# Patient Record
Sex: Female | Born: 1965
Health system: Southern US, Community
[De-identification: ages and names within clinical notes are randomized; demographics above are authoritative.]

## PROBLEM LIST (undated history)

## (undated) DIAGNOSIS — J4520 Mild intermittent asthma, uncomplicated: Secondary | ICD-10-CM

## (undated) HISTORY — DX: Mild intermittent asthma, uncomplicated: J45.20

---

## 2018-01-09 ENCOUNTER — Ambulatory Visit: Payer: Managed Care, Other (non HMO) | Admitting: Allergy and Immunology

## 2018-01-09 ENCOUNTER — Encounter: Payer: Self-pay | Admitting: Allergy and Immunology

## 2018-01-09 VITALS — BP 140/76 | HR 78 | Temp 98.0°F | Resp 16 | Ht 73.0 in | Wt 365.4 lb

## 2018-01-09 DIAGNOSIS — J3089 Other allergic rhinitis: Secondary | ICD-10-CM

## 2018-01-09 DIAGNOSIS — J4520 Mild intermittent asthma, uncomplicated: Secondary | ICD-10-CM

## 2018-01-09 HISTORY — DX: Mild intermittent asthma, uncomplicated: J45.20

## 2018-01-09 MED ORDER — EPINEPHRINE 0.3 MG/0.3ML IJ SOAJ: 0.3000 mg | Freq: Once | INTRAMUSCULAR | 2 refills | Status: AC

## 2018-01-09 NOTE — Progress Notes (Signed)
Pt brought in allergy vials from outside source to continue immunotherapy in our clinic. Office procedures for immunotherapy explained and all consent forms signed. Pt verbalizes understanding. Given injections today per [facility] protocol. [place] and Vial M, to received injection 2x a week at a maintanance dose of .20 on each. Pt states that she has EpiPen.  Medical Practice contact:  [facility] Saba S. Marland McalpineSheikh, MD [address] [place], [place] [id number] Phone: [phone] Fax:      [phone]

## 2018-01-09 NOTE — Assessment & Plan Note (Signed)
The patient will resume aeroallergen immunotherapy under our care using the allergen vaccines and protocol provided by her previous allergist. She has access to epinephrine autoinjector.  For now, continue appropriate allergen avoidance measures as well as cetirizine and/or fluticasone nasal spray as needed.  Nasal saline spray (i.e., Simply Saline) or nasal saline lavage (i.e., NeilMed) is recommended as needed and prior to medicated nasal sprays.  Return for follow-up in 6 months, or sooner if necessary.

## 2018-01-09 NOTE — Assessment & Plan Note (Signed)
   Continue albuterol HFA, 1 to 2 inhalations every 6 hours if needed.  Subjective and objective measures of pulmonary function will be followed and the treatment plan will be adjusted accordingly. 

## 2018-01-09 NOTE — Progress Notes (Signed)
New Patient Note  RE: Rachel Mercado MRN: [id number] DOB: 26-Jun-1966 Date of Office Visit: 01/09/2018  Referring provider: No ref. provider found Primary care provider: Patient, No Pcp Per  Chief Complaint: Allergic Rhinitis  and Asthma   History of present illness: Rachel Mercado is a 52 y.o. female presenting today for evaluation of allergic rhinitis and asthma.  She reports that approximately 1 year ago she had an asthma exacerbation while on a commuter train during high pollen counts.  She went to the emergency department and was informed that she had had an allergy induced asthma attack.  She was referred to an allergist was found to be poly-sensitized to seasonal and perennial aeroallergens and was started on immunotherapy injections.  She is currently at maintenance dose of immunotherapy.  She has only required albuterol on one occasion over the past year, this occurred with exposure to high pollen counts.  She notes that prior to the allergy injections she had experienced nasal congestion, rhinorrhea, sneezing, and postnasal drainage.  Assessment and plan: Mild intermittent asthma  Continue albuterol HFA, 1 to 2 inhalations every 6 hours if needed.  Subjective and objective measures of pulmonary function will be followed and the treatment plan will be adjusted accordingly.  Perennial and seasonal allergic rhinitis The patient will resume aeroallergen immunotherapy under our care using the allergen vaccines and protocol provided by her previous allergist. She has access to epinephrine autoinjector.  For now, continue appropriate allergen avoidance measures as well as cetirizine and/or fluticasone nasal spray as needed.  Nasal saline spray (i.e., Simply Saline) or nasal saline lavage (i.e., NeilMed) is recommended as needed and prior to medicated nasal sprays.  Return for follow-up in 6 months, or sooner if necessary.   Meds ordered this encounter  Medications  .  EPINEPHrine (AUVI-Q) 0.3 mg/0.3 mL IJ SOAJ injection    Sig: Inject 0.3 mLs (0.3 mg total) into the muscle once for 1 dose. As directed for life-threatening allergic reactions    Dispense:  4 Device    Refill:  2    Diagnostics: Spirometry: Spirometry reveals an FVC of 3.32 L and an FEV1 of 2.97 L (97% predicted) with 6% postbronchodilator improvement.    Physical examination: Blood pressure 140/76, pulse 78, temperature 98 F (36.7 C), temperature source Oral, resp. rate 16, height 6\' 1"  (1.854 m), weight (!) 365 lb 6.4 oz (165.7 kg), SpO2 98 %.  General: Alert, interactive, in no acute distress. HEENT: TMs pearly gray, turbinates mildly edematous with clear discharge, post-pharynx unremarkable. Neck: Supple without lymphadenopathy. Lungs: Clear to auscultation without wheezing, rhonchi or rales. CV: Normal S1, S2 without murmurs. Abdomen: Nondistended, nontender. Skin: Warm and dry, without lesions or rashes. Extremities:  No clubbing, cyanosis or edema. Neuro:   Grossly intact.  Review of systems:  Review of systems negative except as noted in HPI / PMHx or noted below: Review of Systems  Constitutional: Negative.   HENT: Negative.   Eyes: Negative.   Respiratory: Negative.   Cardiovascular: Negative.   Gastrointestinal: Negative.   Genitourinary: Negative.   Musculoskeletal: Negative.   Skin: Negative.   Neurological: Negative.   Endo/Heme/Allergies: Negative.   Psychiatric/Behavioral: Negative.     Past medical history:  Past Medical History:  Diagnosis Date  . Mild intermittent asthma 01/09/2018    Past surgical history:  History reviewed. No pertinent surgical history.  Family history: Family History  Problem Relation Age of Onset  . Allergic rhinitis Neg Hx   . Asthma Neg  Hx   . Eczema Neg Hx     Social history: Social History   Socioeconomic History  . Marital status: Single    Spouse name: Not on file  . Number of children: Not on file  . Years  of education: Not on file  . Highest education level: Not on file  Occupational History  . Not on file  Social Needs  . Financial resource strain: Not on file  . Food insecurity:    Worry: Not on file    Inability: Not on file  . Transportation needs:    Medical: Not on file    Non-medical: Not on file  Tobacco Use  . Smoking status: Never Smoker  . Smokeless tobacco: Never Used  Substance and Sexual Activity  . Alcohol use: Never    Frequency: Never  . Drug use: Never  . Sexual activity: Not on file  Lifestyle  . Physical activity:    Days per week: Not on file    Minutes per session: Not on file  . Stress: Not on file  Relationships  . Social connections:    Talks on phone: Not on file    Gets together: Not on file    Attends religious service: Not on file    Active member of club or organization: Not on file    Attends meetings of clubs or organizations: Not on file    Relationship status: Not on file  . Intimate partner violence:    Fear of current or ex partner: Not on file    Emotionally abused: Not on file    Physically abused: Not on file    Forced sexual activity: Not on file  Other Topics Concern  . Not on file  Social History Narrative  . Not on file   Environmental History: The patient lives in a 53 year old house with carpeting the bedroom, gas heat, and central air.  There is no known mold/water damage in the home.  She is a non-smoker without pets.  Allergies as of 01/09/2018   No Known Allergies     Medication List        Accurate as of 01/09/18  1:29 PM. Always use your most recent med list.          cetirizine 5 MG tablet Commonly known as:  ZYRTEC Take 5 mg by mouth as needed for allergies.   EPINEPHrine 0.3 mg/0.3 mL Soaj injection Commonly known as:  AUVI-Q Inject 0.3 mLs (0.3 mg total) into the muscle once for 1 dose. As directed for life-threatening allergic reactions   fluticasone 50 MCG/ACT nasal spray Commonly known as:   FLONASE Place 1 spray into both nostrils as needed for allergies or rhinitis.   multivitamin tablet Take 1 tablet by mouth daily.   PROAIR RESPICLICK 108 (90 Base) MCG/ACT Aepb Generic drug:  Albuterol Sulfate Inhale 90 Inhalers into the lungs as needed.   Vitamin D (Ergocalciferol) 50000 units Caps capsule Commonly known as:  DRISDOL Take 5,000 Units by mouth 1 day or 1 dose.       Known medication allergies: No Known Allergies  I appreciate the opportunity to take part in Rachel Mercado's care. Please do not hesitate to contact me with questions.  Sincerely,   R. Jorene Guestarter Johnnathan Hagemeister, MD

## 2018-01-09 NOTE — Patient Instructions (Addendum)
Mild intermittent asthma  Continue albuterol HFA, 1 to 2 inhalations every 6 hours if needed.  Subjective and objective measures of pulmonary function will be followed and the treatment plan will be adjusted accordingly.  Perennial and seasonal allergic rhinitis The patient will resume aeroallergen immunotherapy under our care using the allergen vaccines and protocol provided by her previous allergist. She has access to epinephrine autoinjector.  For now, continue appropriate allergen avoidance measures as well as cetirizine and/or fluticasone nasal spray as needed.  Nasal saline spray (i.e., Simply Saline) or nasal saline lavage (i.e., NeilMed) is recommended as needed and prior to medicated nasal sprays.  Return for follow-up in 6 months, or sooner if necessary.   Return in about 6 months (around 07/11/2018), or if symptoms worsen or fail to improve.  Reducing Pollen Exposure  The American Academy of Allergy, Asthma and Immunology suggests the following steps to reduce your exposure to pollen during allergy seasons.    1. Do not hang sheets or clothing out to dry; pollen may collect on these items. 2. Do not mow lawns or spend time around freshly cut grass; mowing stirs up pollen. 3. Keep windows closed at night.  Keep car windows closed while driving. 4. Minimize morning activities outdoors, a time when pollen counts are usually at their highest. 5. Stay indoors as much as possible when pollen counts or humidity is high and on windy days when pollen tends to remain in the air longer. 6. Use air conditioning when possible.  Many air conditioners have filters that trap the pollen spores. 7. Use a HEPA room air filter to remove pollen form the indoor air you breathe.   Control of House Dust Mite Allergen  House dust mites play a major role in allergic asthma and rhinitis.  They occur in environments with high humidity wherever human skin, the food for dust mites is found. High levels  have been detected in dust obtained from mattresses, pillows, carpets, upholstered furniture, bed covers, clothes and soft toys.  The principal allergen of the house dust mite is found in its feces.  A gram of dust may contain 1,000 mites and 250,000 fecal particles.  Mite antigen is easily measured in the air during house cleaning activities.    1. Encase mattresses, including the box spring, and pillow, in an air tight cover.  Seal the zipper end of the encased mattresses with wide adhesive tape. 2. Wash the bedding in water of 130 degrees Farenheit weekly.  Avoid cotton comforters/quilts and flannel bedding: the most ideal bed covering is the dacron comforter. 3. Remove all upholstered furniture from the bedroom. 4. Remove carpets, carpet padding, rugs, and non-washable window drapes from the bedroom.  Wash drapes weekly or use plastic window coverings. 5. Remove all non-washable stuffed toys from the bedroom.  Wash stuffed toys weekly. 6. Have the room cleaned frequently with a vacuum cleaner and a damp dust-mop.  The patient should not be in a room which is being cleaned and should wait 1 hour after cleaning before going into the room. 7. Close and seal all heating outlets in the bedroom.  Otherwise, the room will become filled with dust-laden air.  An electric heater can be used to heat the room. Reduce indoor humidity to less than 50%.  Do not use a humidifier.  Control of Dog or Cat Allergen  Avoidance is the best way to manage a dog or cat allergy. If you have a dog or cat and are allergic to  dog or cats, consider removing the dog or cat from the home. If you have a dog or cat but don't want to find it a new home, or if your family wants a pet even though someone in the household is allergic, here are some strategies that may help keep symptoms at bay:  1. Keep the pet out of your bedroom and restrict it to only a few rooms. Be advised that keeping the dog or cat in only one room will not  limit the allergens to that room. 2. Don't pet, hug or kiss the dog or cat; if you do, wash your hands with soap and water. 3. High-efficiency particulate air (HEPA) cleaners run continuously in a bedroom or living room can reduce allergen levels over time. 4. Place electrostatic material sheet in the air inlet vent in the bedroom. 5. Regular use of a high-efficiency vacuum cleaner or a central vacuum can reduce allergen levels. 6. Giving your dog or cat a bath at least once a week can reduce airborne allergen.  Control of Mold Allergen  Mold and fungi can grow on a variety of surfaces provided certain temperature and moisture conditions exist.  Outdoor molds grow on plants, decaying vegetation and soil.  The major outdoor mold, Alternaria and Cladosporium, are found in very high numbers during hot and dry conditions.  Generally, a late Summer - Fall peak is seen for common outdoor fungal spores.  Rain will temporarily lower outdoor mold spore count, but counts rise rapidly when the rainy period ends.  The most important indoor molds are Aspergillus and Penicillium.  Dark, humid and poorly ventilated basements are ideal sites for mold growth.  The next most common sites of mold growth are the bathroom and the kitchen.  Outdoor MicrosoftMold Control 1. Use air conditioning and keep windows closed 2. Avoid exposure to decaying vegetation. 3. Avoid leaf raking. 4. Avoid grain handling. 5. Consider wearing a face mask if working in moldy areas.  Indoor Mold Control 1. Maintain humidity below 50%. 2. Clean washable surfaces with 5% bleach solution. 3. Remove sources e.g. Contaminated carpets.  Control of Cockroach Allergen  Cockroach allergen has been identified as an important cause of acute attacks of asthma, especially in urban settings.  There are fifty-five species of cockroach that exist in the [place], however only three, the [place], [place] and Oriental species produce allergen that can  affect patients with Asthma.  Allergens can be obtained from fecal particles, egg casings and secretions from cockroaches.    1. Remove food sources. 2. Reduce access to water. 3. Seal access and entry points. 4. Spray runways with 0.5-1% Diazinon or Chlorpyrifos 5. Blow boric acid power under stoves and refrigerator. 6. Place bait stations (hydramethylnon) at feeding sites.

## 2018-01-10 ENCOUNTER — Telehealth: Payer: Self-pay

## 2018-01-10 NOTE — Telephone Encounter (Signed)
Left message for patient to call. We have contacted [facility] regarding her allergy vial transfer to our clinic. Vial were unlabeled as to the specific extract used. They informed us that they are a [employer] company which allows patients to administer injections to themselves at home and they are not at liberty to disclose serum information. They have also stated that once the patients vials expire or run out, they cannot re-order vials. Pt will have to start on our serum injections and restart her immunotherapy with us.

## 2018-01-11 NOTE — Telephone Encounter (Signed)
Pt advised of [facility] protocol for inj. Plans to have allergy testing with us.

## 2018-01-17 ENCOUNTER — Ambulatory Visit (INDEPENDENT_AMBULATORY_CARE_PROVIDER_SITE_OTHER): Payer: Managed Care, Other (non HMO) | Admitting: *Deleted

## 2018-01-17 DIAGNOSIS — J309 Allergic rhinitis, unspecified: Secondary | ICD-10-CM

## 2018-01-24 ENCOUNTER — Ambulatory Visit (INDEPENDENT_AMBULATORY_CARE_PROVIDER_SITE_OTHER): Payer: Managed Care, Other (non HMO) | Admitting: *Deleted

## 2018-01-24 DIAGNOSIS — J309 Allergic rhinitis, unspecified: Secondary | ICD-10-CM

## 2018-07-31 ENCOUNTER — Encounter: Payer: Self-pay | Admitting: Allergy and Immunology

## 2018-07-31 ENCOUNTER — Encounter: Payer: 59 | Admitting: Allergy and Immunology

## 2018-07-31 MED ORDER — ALBUTEROL SULFATE 108 (90 BASE) MCG/ACT IN AEPB: 90.0000 | INHALATION_SPRAY | RESPIRATORY_TRACT | 0 refills | Status: DC | PRN

## 2018-07-31 NOTE — Progress Notes (Signed)
This encounter was created in error - please disregard.

## 2018-08-07 ENCOUNTER — Ambulatory Visit (INDEPENDENT_AMBULATORY_CARE_PROVIDER_SITE_OTHER): Payer: 59 | Admitting: Allergy and Immunology

## 2018-08-07 ENCOUNTER — Encounter: Payer: Self-pay | Admitting: Allergy and Immunology

## 2018-08-07 VITALS — BP 108/66 | HR 80 | Resp 18 | Ht 74.0 in | Wt 380.0 lb

## 2018-08-07 DIAGNOSIS — H1013 Acute atopic conjunctivitis, bilateral: Secondary | ICD-10-CM | POA: Diagnosis not present

## 2018-08-07 DIAGNOSIS — J3089 Other allergic rhinitis: Secondary | ICD-10-CM | POA: Diagnosis not present

## 2018-08-07 DIAGNOSIS — J4520 Mild intermittent asthma, uncomplicated: Secondary | ICD-10-CM

## 2018-08-07 DIAGNOSIS — H1010 Acute atopic conjunctivitis, unspecified eye: Secondary | ICD-10-CM | POA: Insufficient documentation

## 2018-08-07 MED ORDER — OLOPATADINE HCL 0.2 % OP SOLN: 1.0000 [drp] | OPHTHALMIC | 5 refills | Status: DC

## 2018-08-07 MED ORDER — LEVOCETIRIZINE DIHYDROCHLORIDE 5 MG PO TABS: 5.0000 mg | ORAL_TABLET | Freq: Every evening | ORAL | 5 refills | Status: DC

## 2018-08-07 MED ORDER — AZELASTINE-FLUTICASONE 137-50 MCG/ACT NA SUSP: 2.0000 | NASAL | 5 refills | Status: DC

## 2018-08-07 NOTE — Assessment & Plan Note (Signed)
   Treatment plan as outlined above for allergic rhinitis.  A prescription has been provided for Pataday, one drop per eye daily as needed. 

## 2018-08-07 NOTE — Patient Instructions (Addendum)
Perennial and seasonal allergic rhinitis  Aeroallergen avoidance measures have been discussed and provided in written form.  A prescription has been provided for levocetirizine, 5 mg daily as needed.  A prescription has been provided for Dymista (azelastine/fluticasone) nasal spray, 1 spray per nostril twice daily as needed. Proper nasal spray technique has been discussed and demonstrated.  Nasal saline spray (i.e., Simply Saline) or nasal saline lavage (i.e., NeilMed) is recommended as needed and prior to medicated nasal sprays.  The patient is motivated to restart immunotherapy to reduce symptoms and decrease medication requirement.   Mild intermittent asthma  Continue albuterol HFA, 1 to 2 inhalations every 6 hours if needed.  Subjective and objective measures of pulmonary function will be followed and the treatment plan will be adjusted accordingly.  Allergic conjunctivitis  Treatment plan as outlined above for allergic rhinitis.  A prescription has been provided for Pataday, one drop per eye daily as needed.   Return in about 4 months (around 12/07/2018), or if symptoms worsen or fail to improve.   Control of Dust Mite Allergen  House dust mites play a major role in allergic asthma and rhinitis.  They occur in environments with high humidity wherever human skin, the food for dust mites is found. High levels have been detected in dust obtained from mattresses, pillows, carpets, upholstered furniture, bed covers, clothes and soft toys.  The principal allergen of the house dust mite is found in its feces.  A gram of dust may contain 1,000 mites and 250,000 fecal particles.  Mite antigen is easily measured in the air during house cleaning activities.    1. Encase mattresses, including the box spring, and pillow, in an air tight cover.  Seal the zipper end of the encased mattresses with wide adhesive tape. 2. Wash the bedding in water of 130 degrees Farenheit weekly.  Avoid cotton  comforters/quilts and flannel bedding: the most ideal bed covering is the dacron comforter. 3. Remove all upholstered furniture from the bedroom. 4. Remove carpets, carpet padding, rugs, and non-washable window drapes from the bedroom.  Wash drapes weekly or use plastic window coverings. 5. Remove all non-washable stuffed toys from the bedroom.  Wash stuffed toys weekly. 6. Have the room cleaned frequently with a vacuum cleaner and a damp dust-mop.  The patient should not be in a room which is being cleaned and should wait 1 hour after cleaning before going into the room. 7. Close and seal all heating outlets in the bedroom.  Otherwise, the room will become filled with dust-laden air.  An electric heater can be used to heat the room. Reduce indoor humidity to less than 50%.  Do not use a humidifier.   Reducing Pollen Exposure  The American Academy of Allergy, Asthma and Immunology suggests the following steps to reduce your exposure to pollen during allergy seasons.    1. Do not hang sheets or clothing out to dry; pollen may collect on these items. 2. Do not mow lawns or spend time around freshly cut grass; mowing stirs up pollen. 3. Keep windows closed at night.  Keep car windows closed while driving. 4. Minimize morning activities outdoors, a time when pollen counts are usually at their highest. 5. Stay indoors as much as possible when pollen counts or humidity is high and on windy days when pollen tends to remain in the air longer. 6. Use air conditioning when possible.  Many air conditioners have filters that trap the pollen spores. 7. Use a HEPA room air  filter to remove pollen form the indoor air you breathe.   Control of Dog or Cat Allergen  Avoidance is the best way to manage a dog or cat allergy. If you have a dog or cat and are allergic to dog or cats, consider removing the dog or cat from the home. If you have a dog or cat but don't want to find it a new home, or if your family  wants a pet even though someone in the household is allergic, here are some strategies that may help keep symptoms at bay:  1. Keep the pet out of your bedroom and restrict it to only a few rooms. Be advised that keeping the dog or cat in only one room will not limit the allergens to that room. 2. Don't pet, hug or kiss the dog or cat; if you do, wash your hands with soap and water. 3. High-efficiency particulate air (HEPA) cleaners run continuously in a bedroom or living room can reduce allergen levels over time. 4. Place electrostatic material sheet in the air inlet vent in the bedroom. 5. Regular use of a high-efficiency vacuum cleaner or a central vacuum can reduce allergen levels. 6. Giving your dog or cat a bath at least once a week can reduce airborne allergen.   Control of Mold Allergen  Mold and fungi can grow on a variety of surfaces provided certain temperature and moisture conditions exist.  Outdoor molds grow on plants, decaying vegetation and soil.  The major outdoor mold, Alternaria and Cladosporium, are found in very high numbers during hot and dry conditions.  Generally, a late Summer - Fall peak is seen for common outdoor fungal spores.  Rain will temporarily lower outdoor mold spore count, but counts rise rapidly when the rainy period ends.  The most important indoor molds are Aspergillus and Penicillium.  Dark, humid and poorly ventilated basements are ideal sites for mold growth.  The next most common sites of mold growth are the bathroom and the kitchen.  Outdoor [employer]old Control 1. Use air conditioning and keep windows closed 2. Avoid exposure to decaying vegetation. 3. Avoid leaf raking. 4. Avoid grain handling. 5. Consider wearing a face mask if working in moldy areas.  Indoor Mold Control 1. Maintain humidity below 50%. 2. Clean washable surfaces with 5% bleach solution. 3. Remove sources e.g. Contaminated carpets.   Control of Cockroach Allergen  Cockroach allergen  has been identified as an important cause of acute attacks of asthma, especially in urban settings.  There are fifty-five species of cockroach that exist in the [place], however only three, the [place], [place] and Oriental species produce allergen that can affect patients with Asthma.  Allergens can be obtained from fecal particles, egg casings and secretions from cockroaches.    1. Remove food sources. 2. Reduce access to water. 3. Seal access and entry points. 4. Spray runways with 0.5-1% Diazinon or Chlorpyrifos 5. Blow boric acid power under stoves and refrigerator. 6. Place bait stations (hydramethylnon) at feeding sites.

## 2018-08-07 NOTE — Progress Notes (Signed)
Follow-up Note  RE: Rachel ShirtsGloria Pigman MRN: [id number] DOB: Oct 31, 1965 Date of Office Visit: 08/07/2018  Primary care provider: Patient, No Pcp Per Referring provider: No ref. provider found  History of present illness: Rachel Mercado is a 52 y.o. female presenting today for re-evaluation of rhinoconjunctivitis and intermittent asthma. She was last seen in this clinic in June 2019.  She presents today for allergy skin testing in anticipation of re-mixing vials and restarting aeroallergen immunotherapy injections.  She has been off of antihistamines over the last 3 days in anticipation of today's skin testing.  She reports that she acquired a cat approximately 1 to 2 months ago and has noticed a definite correlation of rhinoconjunctivitis symptoms with exposure to the cat.  She rarely requires albuterol rescue, typically only with vigorous exertion/exercise, does not experience nocturnal awakenings due to lower respiratory symptoms or limitations in normal daily activities due to asthma symptoms.  Assessment and plan: Perennial and seasonal allergic rhinitis  Aeroallergen avoidance measures have been discussed and provided in written form.  A prescription has been provided for levocetirizine, 5 mg daily as needed.  A prescription has been provided for Dymista (azelastine/fluticasone) nasal spray, 1 spray per nostril twice daily as needed. Proper nasal spray technique has been discussed and demonstrated.  Nasal saline spray (i.e., Simply Saline) or nasal saline lavage (i.e., NeilMed) is recommended as needed and prior to medicated nasal sprays.  The patient is motivated to restart immunotherapy to reduce symptoms and decrease medication requirement.   Mild intermittent asthma  Continue albuterol HFA, 1 to 2 inhalations every 6 hours if needed.  Subjective and objective measures of pulmonary function will be followed and the treatment plan will be adjusted accordingly.  Allergic  conjunctivitis  Treatment plan as outlined above for allergic rhinitis.  A prescription has been provided for Pataday, one drop per eye daily as needed.   Meds ordered this encounter  Medications  . levocetirizine (XYZAL) 5 MG tablet    Sig: Take 1 tablet (5 mg total) by mouth every evening.    Dispense:  30 tablet    Refill:  5  . Azelastine-Fluticasone (DYMISTA) 137-50 MCG/ACT SUSP    Sig: Place 2 sprays into both nostrils 1 day or 1 dose.    Dispense:  1 Bottle    Refill:  5  . Olopatadine HCl (PATADAY) 0.2 % SOLN    Sig: Place 1 drop into both eyes 1 day or 1 dose.    Dispense:  1 Bottle    Refill:  5    Diagnostics: Aeroallergen skin testing: Positive to grass pollen, weed pollen, ragweed pollen, tree pollen, cat hair, cockroach antigen, and dust mite antigen.    Physical examination: Blood pressure 108/66, pulse 80, resp. rate 18, height 6\' 2"  (1.88 m), weight (!) 380 lb (172.4 kg), SpO2 97 %.  General: Alert, interactive, in no acute distress. HEENT: TMs pearly gray, turbinates moderately edematous with clear discharge, post-pharynx moderately erythematous. Neck: Supple without lymphadenopathy. Lungs: Clear to auscultation without wheezing, rhonchi or rales. CV: Normal S1, S2 without murmurs. Skin: Warm and dry, without lesions or rashes.  The following portions of the patient's history were reviewed and updated as appropriate: allergies, current medications, past family history, past medical history, past social history, past surgical history and problem list.  Allergies as of 08/07/2018   No Known Allergies     Medication List       Accurate as of August 07, 2018  1:08 PM. Always use  your most recent med list.        Albuterol Sulfate 108 (90 Base) MCG/ACT Aepb Commonly known as:  PROAIR RESPICLICK Inhale 90 Inhalers into the lungs as needed.   Azelastine-Fluticasone 137-50 MCG/ACT Susp Commonly known as:  DYMISTA Place 2 sprays into both nostrils 1  day or 1 dose.   cetirizine 5 MG tablet Commonly known as:  ZYRTEC Take 5 mg by mouth as needed for allergies.   fluticasone 50 MCG/ACT nasal spray Commonly known as:  FLONASE Place 1 spray into both nostrils as needed for allergies or rhinitis.   levocetirizine 5 MG tablet Commonly known as:  XYZAL Take 1 tablet (5 mg total) by mouth every evening.   multivitamin tablet Take 1 tablet by mouth daily.   Olopatadine HCl 0.2 % Soln Commonly known as:  PATADAY Place 1 drop into both eyes 1 day or 1 dose.   Vitamin D (Ergocalciferol) 1.25 MG (50000 UT) Caps capsule Commonly known as:  DRISDOL Take 5,000 Units by mouth 1 day or 1 dose.       No Known Allergies  Review of systems: Review of systems negative except as noted in HPI / PMHx or noted below: Constitutional: Negative.  HENT: Negative.   Eyes: Negative.  Respiratory: Negative.   Cardiovascular: Negative.  Gastrointestinal: Negative.  Genitourinary: Negative.  Musculoskeletal: Negative.  Neurological: Negative.  Endo/Heme/Allergies: Negative.  Cutaneous: Negative.  Past Medical History:  Diagnosis Date  . Mild intermittent asthma 01/09/2018    Family History  Problem Relation Age of Onset  . Allergic rhinitis Neg Hx   . Asthma Neg Hx   . Eczema Neg Hx     Social History   Socioeconomic History  . Marital status: Single    Spouse name: Not on file  . Number of children: Not on file  . Years of education: Not on file  . Highest education level: Not on file  Occupational History  . Not on file  Social Needs  . Financial resource strain: Not on file  . Food insecurity:    Worry: Not on file    Inability: Not on file  . Transportation needs:    Medical: Not on file    Non-medical: Not on file  Tobacco Use  . Smoking status: Never Smoker  . Smokeless tobacco: Never Used  Substance and Sexual Activity  . Alcohol use: Never    Frequency: Never  . Drug use: Never  . Sexual activity: Not on file    Lifestyle  . Physical activity:    Days per week: Not on file    Minutes per session: Not on file  . Stress: Not on file  Relationships  . Social connections:    Talks on phone: Not on file    Gets together: Not on file    Attends religious service: Not on file    Active member of club or organization: Not on file    Attends meetings of clubs or organizations: Not on file    Relationship status: Not on file  . Intimate partner violence:    Fear of current or ex partner: Not on file    Emotionally abused: Not on file    Physically abused: Not on file    Forced sexual activity: Not on file  Other Topics Concern  . Not on file  Social History Narrative  . Not on file    I appreciate the opportunity to take part in Geeta's care. Please do not hesitate  to contact me with questions.  Sincerely,   R. Edgar Frisk, MD

## 2018-08-07 NOTE — Assessment & Plan Note (Signed)
   Continue albuterol HFA, 1 to 2 inhalations every 6 hours if needed.  Subjective and objective measures of pulmonary function will be followed and the treatment plan will be adjusted accordingly. 

## 2018-08-07 NOTE — Assessment & Plan Note (Signed)
   Aeroallergen avoidance measures have been discussed and provided in written form.  A prescription has been provided for levocetirizine, 5 mg daily as needed.  A prescription has been provided for Dymista (azelastine/fluticasone) nasal spray, 1 spray per nostril twice daily as needed. Proper nasal spray technique has been discussed and demonstrated.  Nasal saline spray (i.e., Simply Saline) or nasal saline lavage (i.e., NeilMed) is recommended as needed and prior to medicated nasal sprays.  The patient is motivated to restart immunotherapy to reduce symptoms and decrease medication requirement.

## 2018-08-08 ENCOUNTER — Telehealth: Payer: Self-pay | Admitting: Allergy and Immunology

## 2018-08-08 MED ORDER — AZELASTINE HCL 0.1 % NA SOLN: 1.0000 | Freq: Two times a day (BID) | NASAL | 3 refills | Status: DC | PRN

## 2018-08-08 MED ORDER — FLUTICASONE PROPIONATE 50 MCG/ACT NA SUSP: 1.0000 | Freq: Two times a day (BID) | NASAL | 3 refills | Status: AC | PRN

## 2018-08-08 NOTE — Telephone Encounter (Signed)
Spoke with patient, she is aware that I have called in rx for flonase and astelin.

## 2018-08-08 NOTE — Progress Notes (Signed)
VIALS EXP 08-09-19 

## 2018-08-08 NOTE — Telephone Encounter (Signed)
Pt called and said ins does not cover the Dymista and needs something else. [facility] [address] [phone].

## 2018-08-10 DIAGNOSIS — J3089 Other allergic rhinitis: Secondary | ICD-10-CM | POA: Diagnosis not present

## 2018-08-11 DIAGNOSIS — J301 Allergic rhinitis due to pollen: Secondary | ICD-10-CM

## 2018-08-21 ENCOUNTER — Ambulatory Visit: Payer: 59

## 2018-08-21 ENCOUNTER — Ambulatory Visit: Payer: Self-pay

## 2018-09-01 ENCOUNTER — Ambulatory Visit: Payer: Self-pay

## 2018-09-01 ENCOUNTER — Ambulatory Visit (INDEPENDENT_AMBULATORY_CARE_PROVIDER_SITE_OTHER): Payer: 59 | Admitting: *Deleted

## 2018-09-01 DIAGNOSIS — J309 Allergic rhinitis, unspecified: Secondary | ICD-10-CM

## 2018-09-01 NOTE — Progress Notes (Signed)
Immunotherapy   Patient Details  Name: Rachel Mercado MRN: [id number] Date of Birth: 1965/09/14  09/01/2018  Manfred Shirts started injections for  Grass-Dmite-Cat-Cockroach & Weed-Tree. Following schedule: A  Frequency:2 times per week Epi-Pen:Epi-Pen Available  Consent signed and patient instructions given. No problems after 30 minutes in office.   Mariane Duval 09/01/2018, 9:55 AM

## 2018-09-08 ENCOUNTER — Ambulatory Visit (INDEPENDENT_AMBULATORY_CARE_PROVIDER_SITE_OTHER): Payer: 59 | Admitting: *Deleted

## 2018-09-08 DIAGNOSIS — J309 Allergic rhinitis, unspecified: Secondary | ICD-10-CM

## 2018-09-12 ENCOUNTER — Ambulatory Visit (INDEPENDENT_AMBULATORY_CARE_PROVIDER_SITE_OTHER): Payer: 59

## 2018-09-12 DIAGNOSIS — J309 Allergic rhinitis, unspecified: Secondary | ICD-10-CM | POA: Diagnosis not present

## 2018-09-15 ENCOUNTER — Ambulatory Visit (INDEPENDENT_AMBULATORY_CARE_PROVIDER_SITE_OTHER): Payer: 59

## 2018-09-15 DIAGNOSIS — J309 Allergic rhinitis, unspecified: Secondary | ICD-10-CM | POA: Diagnosis not present

## 2018-09-18 ENCOUNTER — Ambulatory Visit (INDEPENDENT_AMBULATORY_CARE_PROVIDER_SITE_OTHER): Payer: 59 | Admitting: *Deleted

## 2018-09-18 DIAGNOSIS — J309 Allergic rhinitis, unspecified: Secondary | ICD-10-CM

## 2018-09-21 ENCOUNTER — Ambulatory Visit (INDEPENDENT_AMBULATORY_CARE_PROVIDER_SITE_OTHER): Payer: 59 | Admitting: *Deleted

## 2018-09-21 DIAGNOSIS — J309 Allergic rhinitis, unspecified: Secondary | ICD-10-CM

## 2018-09-28 ENCOUNTER — Ambulatory Visit (INDEPENDENT_AMBULATORY_CARE_PROVIDER_SITE_OTHER): Payer: 59

## 2018-09-28 DIAGNOSIS — J309 Allergic rhinitis, unspecified: Secondary | ICD-10-CM

## 2018-10-02 ENCOUNTER — Ambulatory Visit (INDEPENDENT_AMBULATORY_CARE_PROVIDER_SITE_OTHER): Payer: 59

## 2018-10-02 DIAGNOSIS — J309 Allergic rhinitis, unspecified: Secondary | ICD-10-CM

## 2018-10-05 ENCOUNTER — Ambulatory Visit (INDEPENDENT_AMBULATORY_CARE_PROVIDER_SITE_OTHER): Payer: 59 | Admitting: *Deleted

## 2018-10-05 DIAGNOSIS — J309 Allergic rhinitis, unspecified: Secondary | ICD-10-CM

## 2018-10-09 ENCOUNTER — Ambulatory Visit (INDEPENDENT_AMBULATORY_CARE_PROVIDER_SITE_OTHER): Payer: 59

## 2018-10-09 DIAGNOSIS — J309 Allergic rhinitis, unspecified: Secondary | ICD-10-CM | POA: Diagnosis not present

## 2018-10-12 ENCOUNTER — Ambulatory Visit (INDEPENDENT_AMBULATORY_CARE_PROVIDER_SITE_OTHER): Payer: 59 | Admitting: *Deleted

## 2018-10-12 DIAGNOSIS — J309 Allergic rhinitis, unspecified: Secondary | ICD-10-CM | POA: Diagnosis not present

## 2018-10-16 ENCOUNTER — Ambulatory Visit (INDEPENDENT_AMBULATORY_CARE_PROVIDER_SITE_OTHER): Payer: 59

## 2018-10-16 DIAGNOSIS — J309 Allergic rhinitis, unspecified: Secondary | ICD-10-CM | POA: Diagnosis not present

## 2018-10-19 ENCOUNTER — Ambulatory Visit (INDEPENDENT_AMBULATORY_CARE_PROVIDER_SITE_OTHER): Payer: 59 | Admitting: *Deleted

## 2018-10-19 DIAGNOSIS — J309 Allergic rhinitis, unspecified: Secondary | ICD-10-CM

## 2018-10-26 ENCOUNTER — Ambulatory Visit (INDEPENDENT_AMBULATORY_CARE_PROVIDER_SITE_OTHER): Payer: 59

## 2018-10-26 DIAGNOSIS — J309 Allergic rhinitis, unspecified: Secondary | ICD-10-CM

## 2018-10-30 ENCOUNTER — Ambulatory Visit (INDEPENDENT_AMBULATORY_CARE_PROVIDER_SITE_OTHER): Payer: 59 | Admitting: *Deleted

## 2018-10-30 DIAGNOSIS — J309 Allergic rhinitis, unspecified: Secondary | ICD-10-CM

## 2018-11-02 ENCOUNTER — Ambulatory Visit (INDEPENDENT_AMBULATORY_CARE_PROVIDER_SITE_OTHER): Payer: 59

## 2018-11-02 DIAGNOSIS — J309 Allergic rhinitis, unspecified: Secondary | ICD-10-CM | POA: Diagnosis not present

## 2018-11-07 ENCOUNTER — Other Ambulatory Visit: Payer: Self-pay

## 2018-11-07 ENCOUNTER — Ambulatory Visit (INDEPENDENT_AMBULATORY_CARE_PROVIDER_SITE_OTHER): Payer: 59

## 2018-11-07 DIAGNOSIS — J309 Allergic rhinitis, unspecified: Secondary | ICD-10-CM

## 2018-11-14 ENCOUNTER — Ambulatory Visit (INDEPENDENT_AMBULATORY_CARE_PROVIDER_SITE_OTHER): Payer: 59

## 2018-11-14 ENCOUNTER — Other Ambulatory Visit: Payer: Self-pay

## 2018-11-14 DIAGNOSIS — J309 Allergic rhinitis, unspecified: Secondary | ICD-10-CM

## 2018-11-21 ENCOUNTER — Other Ambulatory Visit: Payer: Self-pay

## 2018-11-21 ENCOUNTER — Ambulatory Visit (INDEPENDENT_AMBULATORY_CARE_PROVIDER_SITE_OTHER): Payer: 59

## 2018-11-21 DIAGNOSIS — J309 Allergic rhinitis, unspecified: Secondary | ICD-10-CM | POA: Diagnosis not present

## 2018-11-27 ENCOUNTER — Other Ambulatory Visit: Payer: Self-pay

## 2018-11-27 ENCOUNTER — Encounter: Payer: Self-pay | Admitting: Allergy and Immunology

## 2018-11-27 ENCOUNTER — Ambulatory Visit: Payer: 59 | Admitting: Allergy and Immunology

## 2018-11-27 DIAGNOSIS — J4520 Mild intermittent asthma, uncomplicated: Secondary | ICD-10-CM

## 2018-11-27 DIAGNOSIS — J3089 Other allergic rhinitis: Secondary | ICD-10-CM

## 2018-11-27 NOTE — Progress Notes (Signed)
Follow-up Note  RE: Rachel Mercado MRN: [id number] DOB: April 21, 1966 Date of Office Visit: 11/27/2018  Primary care provider: Wilfred Curtis, MD Referring provider: No ref. provider found  History of present illness: Rachel Mercado is a 53 y.o. female with allergic rhinoconjunctivitis and intermittent asthma presenting today for follow-up.  She was last seen in this clinic in December 2019.  She reports that this spring with pollen exposure she has required albuterol rescue 1-2 times per week.  She does not experiencing nocturnal awakenings due to lower respiratory symptoms.  Her nasal allergy symptoms have been well controlled with azelastine nasal spray every evening and fluticasone nasal spray if needed in the morning.  She is receiving aero allergen immunotherapy injections without problems or complications.  Assessment and plan: Perennial and seasonal allergic rhinitis  Continue appropriate allergen avoidance measures, immunotherapy injections as prescribed, azelastine nasal spray, fluticasone nasal spray as needed, and levocetirizine if needed.  Nasal saline spray (i.e., Simply Saline) or nasal saline lavage (i.e., NeilMed) is recommended as needed and prior to medicated nasal sprays.  Mild intermittent asthma  Continue albuterol HFA, 1 to 2 inhalations every 4-6 hours if needed.  Subjective and objective measures of pulmonary function will be followed and the treatment plan will be adjusted accordingly.   Physical examination: Blood pressure (!) 138/58, pulse 82, temperature 98.5 F (36.9 C), temperature source Oral, resp. rate 16, height 5' 11.65" (1.82 m), weight (!) 387 lb (175.5 kg), SpO2 97 %.  General: Alert, interactive, in no acute distress. HEENT: TMs pearly gray, turbinates mildly edematous without discharge, post-pharynx mildly erythematous. Neck: Supple without lymphadenopathy. Lungs: Clear to auscultation without wheezing, rhonchi or rales. CV: Normal S1, S2  without murmurs. Skin: Warm and dry, without lesions or rashes.  The following portions of the patient's history were reviewed and updated as appropriate: allergies, current medications, past family history, past medical history, past social history, past surgical history and problem list.  Allergies as of 11/27/2018      Reactions   Shellfish Allergy Hives      Medication List       Accurate as of November 27, 2018  3:34 PM. Always use your most recent med list.        Albuterol Sulfate 108 (90 Base) MCG/ACT Aepb Commonly known as:  ProAir RespiClick Inhale 90 Inhalers into the lungs as needed.   azelastine 0.1 % nasal spray Commonly known as:  ASTELIN Place 1 spray into both nostrils 2 (two) times daily as needed for rhinitis. Use in each nostril as directed   FERROUS SULFATE IRON PO Take by mouth.   fluticasone 50 MCG/ACT nasal spray Commonly known as:  FLONASE Place 1 spray into both nostrils 2 (two) times daily as needed for allergies or rhinitis.   levocetirizine 5 MG tablet Commonly known as:  XYZAL Take 1 tablet (5 mg total) by mouth every evening.   multivitamin tablet Take 1 tablet by mouth daily.   phentermine 37.5 MG tablet Commonly known as:  ADIPEX-P TAKE 1/2 TAB 30 MIN BEFORE BREAKFAST AND 1/2 TAB AT 2 PM DAILY   Vitamin D (Ergocalciferol) 1.25 MG (50000 UT) Caps capsule Commonly known as:  DRISDOL Take 5,000 Units by mouth 1 day or 1 dose.       Allergies  Allergen Reactions  . Shellfish Allergy Hives    I appreciate the opportunity to take part in Rachel Mercado's care. Please do not hesitate to contact me with questions.  Sincerely,   R.  Edgar Frisk, MD

## 2018-11-27 NOTE — Assessment & Plan Note (Signed)
   Continue appropriate allergen avoidance measures, immunotherapy injections as prescribed, azelastine nasal spray, fluticasone nasal spray as needed, and levocetirizine if needed.  Nasal saline spray (i.e., Simply Saline) or nasal saline lavage (i.e., NeilMed) is recommended as needed and prior to medicated nasal sprays.

## 2018-11-27 NOTE — Patient Instructions (Addendum)
Perennial and seasonal allergic rhinitis  Continue appropriate allergen avoidance measures, immunotherapy injections as prescribed, azelastine nasal spray, fluticasone nasal spray as needed, and levocetirizine if needed.  Nasal saline spray (i.e., Simply Saline) or nasal saline lavage (i.e., NeilMed) is recommended as needed and prior to medicated nasal sprays.  Mild intermittent asthma  Continue albuterol HFA, 1 to 2 inhalations every 4-6 hours if needed.  Subjective and objective measures of pulmonary function will be followed and the treatment plan will be adjusted accordingly.   Return in about 5 months (around 04/29/2019), or if symptoms worsen or fail to improve.

## 2018-11-27 NOTE — Assessment & Plan Note (Signed)
   Continue albuterol HFA, 1 to 2 inhalations every 4-6 hours if needed.  Subjective and objective measures of pulmonary function will be followed and the treatment plan will be adjusted accordingly. 

## 2018-11-28 ENCOUNTER — Ambulatory Visit (INDEPENDENT_AMBULATORY_CARE_PROVIDER_SITE_OTHER): Payer: 59

## 2018-11-28 DIAGNOSIS — J309 Allergic rhinitis, unspecified: Secondary | ICD-10-CM | POA: Diagnosis not present

## 2018-12-05 ENCOUNTER — Other Ambulatory Visit: Payer: Self-pay

## 2018-12-05 ENCOUNTER — Ambulatory Visit (INDEPENDENT_AMBULATORY_CARE_PROVIDER_SITE_OTHER): Payer: 59

## 2018-12-05 DIAGNOSIS — J309 Allergic rhinitis, unspecified: Secondary | ICD-10-CM | POA: Diagnosis not present

## 2018-12-12 ENCOUNTER — Other Ambulatory Visit: Payer: Self-pay

## 2018-12-12 ENCOUNTER — Ambulatory Visit (INDEPENDENT_AMBULATORY_CARE_PROVIDER_SITE_OTHER): Payer: 59

## 2018-12-12 DIAGNOSIS — J309 Allergic rhinitis, unspecified: Secondary | ICD-10-CM | POA: Diagnosis not present

## 2018-12-19 ENCOUNTER — Ambulatory Visit (INDEPENDENT_AMBULATORY_CARE_PROVIDER_SITE_OTHER): Payer: 59

## 2018-12-19 ENCOUNTER — Other Ambulatory Visit: Payer: Self-pay

## 2018-12-19 DIAGNOSIS — J309 Allergic rhinitis, unspecified: Secondary | ICD-10-CM | POA: Diagnosis not present

## 2018-12-26 ENCOUNTER — Other Ambulatory Visit: Payer: Self-pay

## 2018-12-26 ENCOUNTER — Ambulatory Visit (INDEPENDENT_AMBULATORY_CARE_PROVIDER_SITE_OTHER): Payer: 59

## 2018-12-26 DIAGNOSIS — J309 Allergic rhinitis, unspecified: Secondary | ICD-10-CM

## 2019-01-02 ENCOUNTER — Ambulatory Visit (INDEPENDENT_AMBULATORY_CARE_PROVIDER_SITE_OTHER): Payer: 59

## 2019-01-02 ENCOUNTER — Other Ambulatory Visit: Payer: Self-pay

## 2019-01-02 DIAGNOSIS — J309 Allergic rhinitis, unspecified: Secondary | ICD-10-CM

## 2019-01-16 ENCOUNTER — Other Ambulatory Visit: Payer: Self-pay

## 2019-01-16 ENCOUNTER — Ambulatory Visit (INDEPENDENT_AMBULATORY_CARE_PROVIDER_SITE_OTHER): Payer: 59

## 2019-01-16 ENCOUNTER — Telehealth: Payer: Self-pay

## 2019-01-16 DIAGNOSIS — J309 Allergic rhinitis, unspecified: Secondary | ICD-10-CM | POA: Diagnosis not present

## 2019-01-16 MED ORDER — EPINEPHRINE 0.3 MG/0.3ML IJ SOAJ: 0.3000 mg | Freq: Once | INTRAMUSCULAR | 2 refills | Status: AC

## 2019-01-16 NOTE — Telephone Encounter (Signed)
Pt needs refill on Epipen

## 2019-01-23 ENCOUNTER — Ambulatory Visit (INDEPENDENT_AMBULATORY_CARE_PROVIDER_SITE_OTHER): Payer: 59

## 2019-01-23 ENCOUNTER — Other Ambulatory Visit: Payer: Self-pay

## 2019-01-23 DIAGNOSIS — J309 Allergic rhinitis, unspecified: Secondary | ICD-10-CM

## 2019-01-30 ENCOUNTER — Other Ambulatory Visit: Payer: Self-pay

## 2019-01-30 ENCOUNTER — Ambulatory Visit (INDEPENDENT_AMBULATORY_CARE_PROVIDER_SITE_OTHER): Payer: 59

## 2019-01-30 DIAGNOSIS — J309 Allergic rhinitis, unspecified: Secondary | ICD-10-CM

## 2019-02-06 ENCOUNTER — Other Ambulatory Visit: Payer: Self-pay

## 2019-02-06 ENCOUNTER — Ambulatory Visit (INDEPENDENT_AMBULATORY_CARE_PROVIDER_SITE_OTHER): Payer: 59

## 2019-02-06 DIAGNOSIS — J309 Allergic rhinitis, unspecified: Secondary | ICD-10-CM

## 2019-02-20 ENCOUNTER — Ambulatory Visit (INDEPENDENT_AMBULATORY_CARE_PROVIDER_SITE_OTHER): Payer: 59

## 2019-02-20 ENCOUNTER — Other Ambulatory Visit: Payer: Self-pay

## 2019-02-20 DIAGNOSIS — J309 Allergic rhinitis, unspecified: Secondary | ICD-10-CM | POA: Diagnosis not present

## 2019-02-27 ENCOUNTER — Ambulatory Visit (INDEPENDENT_AMBULATORY_CARE_PROVIDER_SITE_OTHER): Payer: 59

## 2019-02-27 ENCOUNTER — Other Ambulatory Visit: Payer: Self-pay

## 2019-02-27 DIAGNOSIS — J309 Allergic rhinitis, unspecified: Secondary | ICD-10-CM

## 2019-03-06 ENCOUNTER — Ambulatory Visit (INDEPENDENT_AMBULATORY_CARE_PROVIDER_SITE_OTHER): Payer: 59

## 2019-03-06 ENCOUNTER — Other Ambulatory Visit: Payer: Self-pay

## 2019-03-06 DIAGNOSIS — J309 Allergic rhinitis, unspecified: Secondary | ICD-10-CM

## 2019-03-13 ENCOUNTER — Ambulatory Visit (INDEPENDENT_AMBULATORY_CARE_PROVIDER_SITE_OTHER): Payer: 59

## 2019-03-13 ENCOUNTER — Other Ambulatory Visit: Payer: Self-pay

## 2019-03-13 DIAGNOSIS — J309 Allergic rhinitis, unspecified: Secondary | ICD-10-CM

## 2019-03-20 ENCOUNTER — Ambulatory Visit (INDEPENDENT_AMBULATORY_CARE_PROVIDER_SITE_OTHER): Payer: 59

## 2019-03-20 ENCOUNTER — Other Ambulatory Visit: Payer: Self-pay

## 2019-03-20 DIAGNOSIS — J309 Allergic rhinitis, unspecified: Secondary | ICD-10-CM

## 2019-03-29 ENCOUNTER — Other Ambulatory Visit: Payer: Self-pay

## 2019-03-29 ENCOUNTER — Ambulatory Visit (INDEPENDENT_AMBULATORY_CARE_PROVIDER_SITE_OTHER): Payer: 59

## 2019-03-29 DIAGNOSIS — J309 Allergic rhinitis, unspecified: Secondary | ICD-10-CM

## 2019-04-03 ENCOUNTER — Other Ambulatory Visit: Payer: Self-pay

## 2019-04-03 ENCOUNTER — Ambulatory Visit (INDEPENDENT_AMBULATORY_CARE_PROVIDER_SITE_OTHER): Payer: 59

## 2019-04-03 DIAGNOSIS — J309 Allergic rhinitis, unspecified: Secondary | ICD-10-CM | POA: Diagnosis not present

## 2019-04-06 ENCOUNTER — Encounter: Payer: Self-pay | Admitting: Emergency Medicine

## 2019-04-06 ENCOUNTER — Emergency Department
Admission: EM | Admit: 2019-04-06 | Discharge: 2019-04-06 | Disposition: A | Discharge status: Home / Self Care | Payer: 59 | Source: Home / Self Care | Attending: Family Medicine | Admitting: Family Medicine

## 2019-04-06 ENCOUNTER — Emergency Department (INDEPENDENT_AMBULATORY_CARE_PROVIDER_SITE_OTHER): Payer: 59

## 2019-04-06 ENCOUNTER — Other Ambulatory Visit: Payer: Self-pay

## 2019-04-06 DIAGNOSIS — M7581 Other shoulder lesions, right shoulder: Secondary | ICD-10-CM

## 2019-04-06 DIAGNOSIS — M7521 Bicipital tendinitis, right shoulder: Secondary | ICD-10-CM

## 2019-04-06 DIAGNOSIS — M7551 Bursitis of right shoulder: Secondary | ICD-10-CM | POA: Diagnosis not present

## 2019-04-06 DIAGNOSIS — M19011 Primary osteoarthritis, right shoulder: Secondary | ICD-10-CM

## 2019-04-06 MED ORDER — PREDNISONE 20 MG PO TABS: ORAL_TABLET | ORAL | 0 refills | Status: DC

## 2019-04-06 MED ORDER — KETOROLAC TROMETHAMINE 30 MG/ML IJ SOLN
30.0000 mg | Freq: Once | INTRAMUSCULAR | Status: AC
  Administered 2019-04-06: 30 mg via INTRAMUSCULAR

## 2019-04-06 NOTE — Discharge Instructions (Addendum)
May take Tylenol as needed for pain.  Wear sling. Apply ice pack for 20 to 30 minutes, 3 to 4 times daily  Continue until pain and swelling decrease.  Begin pendulum exercises as tolerated.

## 2019-04-06 NOTE — ED Triage Notes (Signed)
LT shoulder pain x 2 days, denies injury

## 2019-04-06 NOTE — ED Provider Notes (Signed)
[facility]    CSN: [id number] Arrival date & time: 04/06/19  0904      History   Chief Complaint Chief Complaint  Patient presents with  . Shoulder Pain    HPI Rachel Mercado is a 53 y.o. female.   Patient awoke with stabbing right shoulder pain yesterday that gradually increased through the day, and she now has minimal range of motion.  She recalls no injury or change in activities.  Her job involves use of a Animator. She notes that she injured her right shoulder 30 years ago while working for the [employer].  At that time she had to wear a shoulder immobilizer and undergo PT for 6 weeks.   Shoulder Pain Location:  Shoulder Shoulder location:  R shoulder Injury: no   Pain details:    Quality:  Shooting   Radiates to:  Does not radiate   Severity:  Severe   Onset quality:  Sudden   Duration:  1 day   Timing:  Constant   Progression:  Worsening Handedness:  Right-handed Prior injury to area:  Yes Relieved by:  Nothing Worsened by:  Movement Ineffective treatments:  Ice and NSAIDs Associated symptoms: decreased range of motion and stiffness   Associated symptoms: no fever, no muscle weakness, no neck pain, no numbness, no swelling and no tingling     Past Medical History:  Diagnosis Date  . Mild intermittent asthma 01/09/2018    Patient Active Problem List   Diagnosis Date Noted  . Allergic conjunctivitis 08/07/2018  . Mild intermittent asthma 01/09/2018  . Perennial and seasonal allergic rhinitis 01/09/2018    History reviewed. No pertinent surgical history.  OB History   No obstetric history on file.      Home Medications    Prior to Admission medications   Medication Sig Start Date End Date Taking? Authorizing Provider  ibuprofen (ADVIL) 200 MG tablet Take 200 mg by mouth every 6 (six) hours as needed.   Yes [provider]  Menthol (ICY HOT ADVANCED RELIEF) 7.5 % PTCH Apply topically.   Yes [provider]  Ferrous  Sulfate Dried (FERROUS SULFATE IRON PO) Take by mouth.    [provider]  fluticasone (FLONASE) 50 MCG/ACT nasal spray Place 1 spray into both nostrils 2 (two) times daily as needed for allergies or rhinitis. 08/08/18   Bobbitt, Heywood Iles, MD  levocetirizine (XYZAL) 5 MG tablet Take 1 tablet (5 mg total) by mouth every evening. 08/07/18   Bobbitt, Heywood Iles, MD  Multiple Vitamin (MULTIVITAMIN) tablet Take 1 tablet by mouth daily.    [provider]  predniSONE (DELTASONE) 20 MG tablet Take one tab by mouth twice daily for 4 days, then one daily for 3 days. Take with food. 04/06/19   Lattie Haw, MD  Vitamin D, Ergocalciferol, (DRISDOL) 50000 units CAPS capsule Take 5,000 Units by mouth 1 day or 1 dose.    [provider]    Family History Family History  Problem Relation Age of Onset  . Hyperlipidemia Mother   . Hypertension Mother   . Heart failure Father   . Allergic rhinitis Neg Hx   . Asthma Neg Hx   . Eczema Neg Hx     Social History Social History   Tobacco Use  . Smoking status: Never Smoker  . Smokeless tobacco: Never Used  Substance Use Topics  . Alcohol use: Never    Frequency: Never  . Drug use: Never  Allergies   Shellfish allergy   Review of Systems Review of Systems  Constitutional: Negative for fever.  Musculoskeletal: Positive for stiffness. Negative for neck pain.  All other systems reviewed and are negative.    Physical Exam Triage Vital Signs ED Triage Vitals  Enc Vitals Group     BP 04/06/19 0939 (!) 145/87     Pulse Rate 04/06/19 0939 85     Resp --      Temp 04/06/19 0939 98.2 F (36.8 C)     Temp Source 04/06/19 0939 Oral     SpO2 04/06/19 0939 98 %     Weight 04/06/19 0941 (!) 384 lb (174.2 kg)     Height 04/06/19 0941 5\' 11"  (1.803 m)     Head Circumference --      Peak Flow --      Pain Score 04/06/19 0940 8     Pain Loc --      Pain Edu? --      Excl. in [place]? --    No data found.   Updated Vital Signs BP (!) 145/87 (BP Location: Right Arm)   Pulse 85   Temp 98.2 F (36.8 C) (Oral)   Ht 5\' 11"  (1.803 m)   Wt (!) 174.2 kg   LMP  (LMP Unknown)   SpO2 98%   BMI 53.56 kg/m   Visual Acuity Right Eye Distance:   Left Eye Distance:   Bilateral Distance:    Right Eye Near:   Left Eye Near:    Bilateral Near:     Physical Exam Vitals signs and nursing note reviewed.  Constitutional:      General: She is not in acute distress.    Appearance: She is obese.  HENT:     Head: Normocephalic.  Eyes:     Pupils: Pupils are equal, round, and reactive to light.  Neck:     Musculoskeletal: Normal range of motion.  Cardiovascular:     Rate and Rhythm: Normal rate.  Pulmonary:     Effort: Pulmonary effort is normal.  Musculoskeletal:     Right shoulder: She exhibits decreased range of motion, tenderness, deformity and decreased strength. She exhibits no swelling and no crepitus.       Arms:     Comments: Right shoulder:  The patient has difficulty actively abducting beyond 10 degrees from the vertical, and passively can only abduct about 20 degrees from vertical.  Distinct tenderness over the insertion of biceps tendon and the subacromial bursa.  External range of motion and strength decreased.  Internal rotation strength relatively intact. Apley's test positive.  Patient cannot perform empty can test. Distal range of motion intact.  Skin:    General: Skin is warm and dry.  Neurological:     Mental Status: She is alert.      UC Treatments / Results  Labs (all labs ordered are listed, but only abnormal results are displayed) Labs Reviewed - No data to display  EKG   Radiology Dg Shoulder Right  Result Date: 04/06/2019 CLINICAL DATA:  Right shoulder pain, limited range of motion EXAM: RIGHT SHOULDER - 2+ VIEW COMPARISON:  None. FINDINGS: No acute fracture or malalignment. Glenohumeral joint space is maintained. Mild AC joint arthropathy. Ovoid mineralized  density at the rotator cuff insertion suggesting hydroxy appetite deposition. Visualized portion of the right lung is clear. IMPRESSION: 1. No acute osseous findings. 2. Rotator cuff calcific tendinopathy. 3. Mild AC joint OA. Electronically Signed   By: Hart Carwin  Plundo M.D.   On: 04/06/2019 11:08    Procedures Procedures (including critical care time)  Medications Ordered in UC Medications  ketorolac (TORADOL) 30 MG/ML injection 30 mg (30 mg Intramuscular Given 04/06/19 1011)    Initial Impression / Assessment and Plan / UC Course  I have reviewed the triage vital signs and the nursing notes.  Pertinent labs & imaging results that were available during my care of the patient were reviewed by me and considered in my medical decision making (see chart for details).    Dispensed sling.  Begin prednisone burst/taper. Followup with Sports Medicine physician as soon as possible.    Final Clinical Impressions(s) / UC Diagnoses   Final diagnoses:  Right rotator cuff tendonitis  Bursitis of right shoulder  Tendonitis of long head of biceps brachii of right shoulder     Discharge Instructions     May take Tylenol as needed for pain.  Wear sling. Apply ice pack for 20 to 30 minutes, 3 to 4 times daily  Continue until pain and swelling decrease.  Begin pendulum exercises as tolerated.    ED Prescriptions    Medication Sig Dispense Auth. Provider   predniSONE (DELTASONE) 20 MG tablet Take one tab by mouth twice daily for 4 days, then one daily for 3 days. Take with food. 11 tablet Lattie HawBeese, Alondra Sahni A, MD        Lattie HawBeese, Shemaiah Round A, MD 04/06/19 [phone]

## 2019-04-12 ENCOUNTER — Other Ambulatory Visit: Payer: Self-pay

## 2019-04-12 ENCOUNTER — Ambulatory Visit (INDEPENDENT_AMBULATORY_CARE_PROVIDER_SITE_OTHER): Payer: 59

## 2019-04-12 DIAGNOSIS — J309 Allergic rhinitis, unspecified: Secondary | ICD-10-CM

## 2019-04-19 ENCOUNTER — Ambulatory Visit (INDEPENDENT_AMBULATORY_CARE_PROVIDER_SITE_OTHER): Payer: 59

## 2019-04-19 ENCOUNTER — Other Ambulatory Visit: Payer: Self-pay

## 2019-04-19 DIAGNOSIS — J309 Allergic rhinitis, unspecified: Secondary | ICD-10-CM

## 2019-04-21 ENCOUNTER — Other Ambulatory Visit: Payer: Self-pay | Admitting: Allergy and Immunology

## 2019-04-26 ENCOUNTER — Ambulatory Visit (INDEPENDENT_AMBULATORY_CARE_PROVIDER_SITE_OTHER): Payer: 59

## 2019-04-26 ENCOUNTER — Other Ambulatory Visit: Payer: Self-pay

## 2019-04-26 DIAGNOSIS — J309 Allergic rhinitis, unspecified: Secondary | ICD-10-CM | POA: Diagnosis not present

## 2019-05-01 ENCOUNTER — Other Ambulatory Visit: Payer: Self-pay

## 2019-05-01 ENCOUNTER — Ambulatory Visit: Payer: 59 | Admitting: Allergy and Immunology

## 2019-05-01 ENCOUNTER — Encounter: Payer: Self-pay | Admitting: Allergy and Immunology

## 2019-05-01 VITALS — BP 122/70 | HR 116 | Temp 96.5°F | Resp 18

## 2019-05-01 DIAGNOSIS — J4520 Mild intermittent asthma, uncomplicated: Secondary | ICD-10-CM

## 2019-05-01 DIAGNOSIS — T7800XD Anaphylactic reaction due to unspecified food, subsequent encounter: Secondary | ICD-10-CM

## 2019-05-01 DIAGNOSIS — T7800XA Anaphylactic reaction due to unspecified food, initial encounter: Secondary | ICD-10-CM | POA: Insufficient documentation

## 2019-05-01 DIAGNOSIS — J309 Allergic rhinitis, unspecified: Secondary | ICD-10-CM

## 2019-05-01 DIAGNOSIS — J3089 Other allergic rhinitis: Secondary | ICD-10-CM | POA: Diagnosis not present

## 2019-05-01 NOTE — Progress Notes (Signed)
Follow-up Note  RE: Rachel Mercado MRN: [id number] DOB: Mar 08, 1966 Date of Office Visit: 05/01/2019  Primary care provider: Wilfred Curtis, MD Referring provider: Wilfred Curtis, MD  History of present illness: Porsche Noguchi is a 53 y.o. female with rhinoconjunctivitis, food allergy, and intermittent asthma presented today for follow-up.  She was last seen in this clinic on November 27, 2018.  Her nasal and ocular allergy symptoms are currently well controlled while receiving immunotherapy injections and taking levocetirizine most evenings.  She reports that she is not requiring the nasal allergy sprays.  She is receiving immunotherapy injections without problems or complications.  She avoids shellfish.  In the interval since her previous visit she has not required asthma rescue medication, experienced nocturnal awakenings due to lower respiratory symptoms, nor have activities of daily living been limited.  Assessment and plan: Perennial and seasonal allergic rhinitis  Continue appropriate allergen avoidance measures, immunotherapy injections as prescribed, azelastine nasal spray, fluticasone nasal spray as needed, and levocetirizine as needed.  To avoid diminishing benefit with daily use (tachyphylaxis) of second generation antihistamine, consider alternating every few months between fexofenadine (Allegra) and levocetirizine (Xyzal).  Nasal saline spray (i.e., Simply Saline) or nasal saline lavage (i.e., NeilMed) is recommended as needed and prior to medicated nasal sprays.  Mild intermittent asthma  Continue albuterol HFA, 1 to 2 inhalations every 4-6 hours if needed.  Subjective and objective measures of pulmonary function will be followed and the treatment plan will be adjusted accordingly.  Food allergy  Continue careful avoidance of shellfish and have access to epinephrine autoinjector 2 pack in case of accidental ingestion.   Physical examination: Blood pressure 122/70, pulse (!)  116, temperature (!) 96.5 F (35.8 C), temperature source Temporal, resp. rate 18, SpO2 99 %.  General: Alert, interactive, in no acute distress. HEENT: TMs pearly gray, turbinates minimally edematous without discharge, post-pharynx unremarkable. Neck: Supple without lymphadenopathy. Lungs: Clear to auscultation without wheezing, rhonchi or rales. CV: Normal S1, S2 without murmurs. Skin: Warm and dry, without lesions or rashes.  The following portions of the patient's history were reviewed and updated as appropriate: allergies, current medications, past family history, past medical history, past social history, past surgical history and problem list.  Allergies as of 05/01/2019      Reactions   Shellfish Allergy Hives      Medication List       Accurate as of May 01, 2019 12:38 PM. If you have any questions, ask your nurse or doctor.        FERROUS SULFATE IRON PO Take by mouth.   fluticasone 50 MCG/ACT nasal spray Commonly known as: FLONASE Place 1 spray into both nostrils 2 (two) times daily as needed for allergies or rhinitis.   ibuprofen 200 MG tablet Commonly known as: ADVIL Take 200 mg by mouth every 6 (six) hours as needed.   Icy Hot Advanced Relief 7.5 % Ptch Generic drug: Menthol Apply topically.   levocetirizine 5 MG tablet Commonly known as: XYZAL TAKE 1 TABLET BY MOUTH EVERY DAY IN THE EVENING What changed: Another medication with the same name was removed. Continue taking this medication, and follow the directions you see here. Changed by: Wellington Hampshire, MD   multivitamin tablet Take 1 tablet by mouth daily.   predniSONE 20 MG tablet Commonly known as: DELTASONE Take one tab by mouth twice daily for 4 days, then one daily for 3 days. Take with food.   Vitamin D (Ergocalciferol) 1.25 MG (50000 UT) Caps  capsule Commonly known as: DRISDOL Take 5,000 Units by mouth 1 day or 1 dose.       Allergies  Allergen Reactions  . Shellfish Allergy  Hives    I appreciate the opportunity to take part in Emerie's care. Please do not hesitate to contact me with questions.  Sincerely,   R. Edgar Frisk, MD

## 2019-05-01 NOTE — Assessment & Plan Note (Signed)
   Continue appropriate allergen avoidance measures, immunotherapy injections as prescribed, azelastine nasal spray, fluticasone nasal spray as needed, and levocetirizine as needed.  To avoid diminishing benefit with daily use (tachyphylaxis) of second generation antihistamine, consider alternating every few months between fexofenadine (Allegra) and levocetirizine (Xyzal).  Nasal saline spray (i.e., Simply Saline) or nasal saline lavage (i.e., NeilMed) is recommended as needed and prior to medicated nasal sprays.

## 2019-05-01 NOTE — Assessment & Plan Note (Signed)
   Continue albuterol HFA, 1 to 2 inhalations every 4-6 hours if needed.  Subjective and objective measures of pulmonary function will be followed and the treatment plan will be adjusted accordingly. 

## 2019-05-01 NOTE — Assessment & Plan Note (Addendum)
   Continue careful avoidance of shellfish and have access to epinephrine autoinjector 2 pack in case of accidental ingestion. 

## 2019-05-01 NOTE — Patient Instructions (Addendum)
Perennial and seasonal allergic rhinitis  Continue appropriate allergen avoidance measures, immunotherapy injections as prescribed, azelastine nasal spray, fluticasone nasal spray as needed, and levocetirizine as needed.  To avoid diminishing benefit with daily use (tachyphylaxis) of second generation antihistamine, consider alternating every few months between fexofenadine (Allegra) and levocetirizine (Xyzal).  Nasal saline spray (i.e., Simply Saline) or nasal saline lavage (i.e., NeilMed) is recommended as needed and prior to medicated nasal sprays.  Mild intermittent asthma  Continue albuterol HFA, 1 to 2 inhalations every 4-6 hours if needed.  Subjective and objective measures of pulmonary function will be followed and the treatment plan will be adjusted accordingly.  Food allergy  Continue careful avoidance of shellfish and have access to epinephrine autoinjector 2 pack in case of accidental ingestion.   Return in about 1 year (around 04/30/2020), or if symptoms worsen or fail to improve.

## 2019-05-10 ENCOUNTER — Other Ambulatory Visit: Payer: Self-pay

## 2019-05-10 ENCOUNTER — Ambulatory Visit (INDEPENDENT_AMBULATORY_CARE_PROVIDER_SITE_OTHER): Payer: 59

## 2019-05-10 DIAGNOSIS — J309 Allergic rhinitis, unspecified: Secondary | ICD-10-CM

## 2019-05-17 ENCOUNTER — Other Ambulatory Visit: Payer: Self-pay

## 2019-05-17 ENCOUNTER — Ambulatory Visit (INDEPENDENT_AMBULATORY_CARE_PROVIDER_SITE_OTHER): Payer: 59

## 2019-05-17 DIAGNOSIS — J309 Allergic rhinitis, unspecified: Secondary | ICD-10-CM | POA: Diagnosis not present

## 2019-05-21 DIAGNOSIS — J301 Allergic rhinitis due to pollen: Secondary | ICD-10-CM

## 2019-05-22 ENCOUNTER — Ambulatory Visit (INDEPENDENT_AMBULATORY_CARE_PROVIDER_SITE_OTHER): Payer: 59

## 2019-05-22 ENCOUNTER — Other Ambulatory Visit: Payer: Self-pay

## 2019-05-22 DIAGNOSIS — J309 Allergic rhinitis, unspecified: Secondary | ICD-10-CM

## 2019-05-31 ENCOUNTER — Ambulatory Visit (INDEPENDENT_AMBULATORY_CARE_PROVIDER_SITE_OTHER): Payer: 59

## 2019-05-31 ENCOUNTER — Other Ambulatory Visit: Payer: Self-pay

## 2019-05-31 DIAGNOSIS — J309 Allergic rhinitis, unspecified: Secondary | ICD-10-CM

## 2019-06-14 ENCOUNTER — Ambulatory Visit (INDEPENDENT_AMBULATORY_CARE_PROVIDER_SITE_OTHER): Payer: 59

## 2019-06-14 ENCOUNTER — Other Ambulatory Visit: Payer: Self-pay

## 2019-06-14 DIAGNOSIS — J309 Allergic rhinitis, unspecified: Secondary | ICD-10-CM

## 2019-06-28 ENCOUNTER — Ambulatory Visit (INDEPENDENT_AMBULATORY_CARE_PROVIDER_SITE_OTHER): Payer: 59

## 2019-06-28 DIAGNOSIS — J309 Allergic rhinitis, unspecified: Secondary | ICD-10-CM | POA: Diagnosis not present

## 2019-07-03 ENCOUNTER — Other Ambulatory Visit: Payer: Self-pay

## 2019-07-03 ENCOUNTER — Ambulatory Visit (INDEPENDENT_AMBULATORY_CARE_PROVIDER_SITE_OTHER): Payer: 59

## 2019-07-03 DIAGNOSIS — J309 Allergic rhinitis, unspecified: Secondary | ICD-10-CM

## 2019-07-12 ENCOUNTER — Ambulatory Visit (INDEPENDENT_AMBULATORY_CARE_PROVIDER_SITE_OTHER): Payer: 59

## 2019-07-12 ENCOUNTER — Other Ambulatory Visit: Payer: Self-pay

## 2019-07-12 DIAGNOSIS — J309 Allergic rhinitis, unspecified: Secondary | ICD-10-CM

## 2019-07-19 ENCOUNTER — Other Ambulatory Visit: Payer: Self-pay

## 2019-07-19 ENCOUNTER — Ambulatory Visit (INDEPENDENT_AMBULATORY_CARE_PROVIDER_SITE_OTHER): Payer: 59

## 2019-07-19 DIAGNOSIS — J309 Allergic rhinitis, unspecified: Secondary | ICD-10-CM

## 2019-07-26 ENCOUNTER — Other Ambulatory Visit: Payer: Self-pay

## 2019-07-26 ENCOUNTER — Ambulatory Visit (INDEPENDENT_AMBULATORY_CARE_PROVIDER_SITE_OTHER): Payer: 59

## 2019-07-26 DIAGNOSIS — J309 Allergic rhinitis, unspecified: Secondary | ICD-10-CM

## 2019-07-31 ENCOUNTER — Ambulatory Visit (INDEPENDENT_AMBULATORY_CARE_PROVIDER_SITE_OTHER): Payer: 59

## 2019-07-31 ENCOUNTER — Other Ambulatory Visit: Payer: Self-pay

## 2019-07-31 DIAGNOSIS — J309 Allergic rhinitis, unspecified: Secondary | ICD-10-CM | POA: Diagnosis not present

## 2019-08-07 ENCOUNTER — Other Ambulatory Visit: Payer: Self-pay

## 2019-08-07 ENCOUNTER — Ambulatory Visit (INDEPENDENT_AMBULATORY_CARE_PROVIDER_SITE_OTHER): Payer: 59

## 2019-08-07 DIAGNOSIS — J309 Allergic rhinitis, unspecified: Secondary | ICD-10-CM | POA: Diagnosis not present

## 2019-08-16 ENCOUNTER — Ambulatory Visit (INDEPENDENT_AMBULATORY_CARE_PROVIDER_SITE_OTHER): Payer: 59

## 2019-08-16 ENCOUNTER — Other Ambulatory Visit: Payer: Self-pay

## 2019-08-16 DIAGNOSIS — J309 Allergic rhinitis, unspecified: Secondary | ICD-10-CM

## 2019-08-23 ENCOUNTER — Other Ambulatory Visit: Payer: Self-pay

## 2019-08-23 ENCOUNTER — Ambulatory Visit (INDEPENDENT_AMBULATORY_CARE_PROVIDER_SITE_OTHER): Payer: 59

## 2019-08-23 DIAGNOSIS — J309 Allergic rhinitis, unspecified: Secondary | ICD-10-CM

## 2019-08-30 ENCOUNTER — Ambulatory Visit (INDEPENDENT_AMBULATORY_CARE_PROVIDER_SITE_OTHER): Payer: 59

## 2019-08-30 ENCOUNTER — Other Ambulatory Visit: Payer: Self-pay

## 2019-08-30 DIAGNOSIS — J309 Allergic rhinitis, unspecified: Secondary | ICD-10-CM | POA: Diagnosis not present

## 2019-09-04 ENCOUNTER — Other Ambulatory Visit: Payer: Self-pay

## 2019-09-04 ENCOUNTER — Ambulatory Visit (INDEPENDENT_AMBULATORY_CARE_PROVIDER_SITE_OTHER): Payer: 59

## 2019-09-04 DIAGNOSIS — J309 Allergic rhinitis, unspecified: Secondary | ICD-10-CM

## 2019-09-05 DIAGNOSIS — J3089 Other allergic rhinitis: Secondary | ICD-10-CM | POA: Diagnosis not present

## 2019-09-06 DIAGNOSIS — J3089 Other allergic rhinitis: Secondary | ICD-10-CM

## 2019-09-06 NOTE — Progress Notes (Signed)
Vials exp 07-16-21 

## 2019-09-13 ENCOUNTER — Other Ambulatory Visit: Payer: Self-pay

## 2019-09-13 ENCOUNTER — Ambulatory Visit (INDEPENDENT_AMBULATORY_CARE_PROVIDER_SITE_OTHER): Payer: 59

## 2019-09-13 DIAGNOSIS — J309 Allergic rhinitis, unspecified: Secondary | ICD-10-CM | POA: Diagnosis not present

## 2019-09-18 ENCOUNTER — Ambulatory Visit (INDEPENDENT_AMBULATORY_CARE_PROVIDER_SITE_OTHER): Payer: 59

## 2019-09-18 ENCOUNTER — Other Ambulatory Visit: Payer: Self-pay

## 2019-09-18 DIAGNOSIS — J309 Allergic rhinitis, unspecified: Secondary | ICD-10-CM

## 2019-09-25 ENCOUNTER — Ambulatory Visit (INDEPENDENT_AMBULATORY_CARE_PROVIDER_SITE_OTHER): Payer: 59

## 2019-09-25 ENCOUNTER — Other Ambulatory Visit: Payer: Self-pay

## 2019-09-25 DIAGNOSIS — J309 Allergic rhinitis, unspecified: Secondary | ICD-10-CM

## 2019-10-02 ENCOUNTER — Other Ambulatory Visit: Payer: Self-pay

## 2019-10-02 ENCOUNTER — Ambulatory Visit (INDEPENDENT_AMBULATORY_CARE_PROVIDER_SITE_OTHER): Payer: 59

## 2019-10-02 DIAGNOSIS — J309 Allergic rhinitis, unspecified: Secondary | ICD-10-CM

## 2019-10-11 ENCOUNTER — Other Ambulatory Visit: Payer: Self-pay

## 2019-10-11 ENCOUNTER — Ambulatory Visit (INDEPENDENT_AMBULATORY_CARE_PROVIDER_SITE_OTHER): Payer: 59

## 2019-10-11 DIAGNOSIS — J309 Allergic rhinitis, unspecified: Secondary | ICD-10-CM

## 2019-10-18 ENCOUNTER — Ambulatory Visit (INDEPENDENT_AMBULATORY_CARE_PROVIDER_SITE_OTHER): Payer: 59

## 2019-10-18 ENCOUNTER — Other Ambulatory Visit: Payer: Self-pay

## 2019-10-18 DIAGNOSIS — J309 Allergic rhinitis, unspecified: Secondary | ICD-10-CM | POA: Diagnosis not present

## 2019-11-01 ENCOUNTER — Other Ambulatory Visit: Payer: Self-pay

## 2019-11-01 ENCOUNTER — Ambulatory Visit (INDEPENDENT_AMBULATORY_CARE_PROVIDER_SITE_OTHER): Payer: 59 | Admitting: *Deleted

## 2019-11-01 DIAGNOSIS — J309 Allergic rhinitis, unspecified: Secondary | ICD-10-CM | POA: Diagnosis not present

## 2019-11-08 ENCOUNTER — Other Ambulatory Visit: Payer: Self-pay

## 2019-11-08 ENCOUNTER — Ambulatory Visit (INDEPENDENT_AMBULATORY_CARE_PROVIDER_SITE_OTHER): Payer: 59

## 2019-11-08 DIAGNOSIS — J309 Allergic rhinitis, unspecified: Secondary | ICD-10-CM | POA: Diagnosis not present

## 2019-11-15 ENCOUNTER — Other Ambulatory Visit: Payer: Self-pay

## 2019-11-15 ENCOUNTER — Ambulatory Visit (INDEPENDENT_AMBULATORY_CARE_PROVIDER_SITE_OTHER): Payer: 59

## 2019-11-15 DIAGNOSIS — J309 Allergic rhinitis, unspecified: Secondary | ICD-10-CM

## 2019-11-20 ENCOUNTER — Ambulatory Visit (INDEPENDENT_AMBULATORY_CARE_PROVIDER_SITE_OTHER): Payer: 59

## 2019-11-20 ENCOUNTER — Other Ambulatory Visit: Payer: Self-pay

## 2019-11-20 DIAGNOSIS — J309 Allergic rhinitis, unspecified: Secondary | ICD-10-CM

## 2019-11-29 ENCOUNTER — Ambulatory Visit (INDEPENDENT_AMBULATORY_CARE_PROVIDER_SITE_OTHER): Payer: 59

## 2019-11-29 ENCOUNTER — Other Ambulatory Visit: Payer: Self-pay

## 2019-11-29 DIAGNOSIS — J309 Allergic rhinitis, unspecified: Secondary | ICD-10-CM | POA: Diagnosis not present

## 2019-12-06 ENCOUNTER — Other Ambulatory Visit: Payer: Self-pay

## 2019-12-06 ENCOUNTER — Ambulatory Visit (INDEPENDENT_AMBULATORY_CARE_PROVIDER_SITE_OTHER): Payer: 59

## 2019-12-06 DIAGNOSIS — J309 Allergic rhinitis, unspecified: Secondary | ICD-10-CM

## 2019-12-08 HISTORY — PX: CARPAL TUNNEL RELEASE: SHX101

## 2019-12-11 ENCOUNTER — Ambulatory Visit (INDEPENDENT_AMBULATORY_CARE_PROVIDER_SITE_OTHER): Payer: 59

## 2019-12-11 ENCOUNTER — Other Ambulatory Visit: Payer: Self-pay

## 2019-12-11 DIAGNOSIS — J309 Allergic rhinitis, unspecified: Secondary | ICD-10-CM

## 2019-12-18 ENCOUNTER — Ambulatory Visit (INDEPENDENT_AMBULATORY_CARE_PROVIDER_SITE_OTHER): Payer: 59

## 2019-12-18 ENCOUNTER — Other Ambulatory Visit: Payer: Self-pay

## 2019-12-18 DIAGNOSIS — J309 Allergic rhinitis, unspecified: Secondary | ICD-10-CM

## 2019-12-19 DIAGNOSIS — J3089 Other allergic rhinitis: Secondary | ICD-10-CM

## 2019-12-19 NOTE — Progress Notes (Signed)
VIALS EXP 12-18-20 

## 2019-12-27 ENCOUNTER — Ambulatory Visit (INDEPENDENT_AMBULATORY_CARE_PROVIDER_SITE_OTHER): Payer: 59

## 2019-12-27 ENCOUNTER — Other Ambulatory Visit: Payer: Self-pay

## 2019-12-27 DIAGNOSIS — J309 Allergic rhinitis, unspecified: Secondary | ICD-10-CM

## 2020-01-03 ENCOUNTER — Ambulatory Visit (INDEPENDENT_AMBULATORY_CARE_PROVIDER_SITE_OTHER): Payer: 59

## 2020-01-03 ENCOUNTER — Other Ambulatory Visit: Payer: Self-pay

## 2020-01-03 DIAGNOSIS — J309 Allergic rhinitis, unspecified: Secondary | ICD-10-CM | POA: Diagnosis not present

## 2020-01-08 ENCOUNTER — Ambulatory Visit (INDEPENDENT_AMBULATORY_CARE_PROVIDER_SITE_OTHER): Payer: 59

## 2020-01-08 ENCOUNTER — Other Ambulatory Visit: Payer: Self-pay

## 2020-01-08 DIAGNOSIS — J309 Allergic rhinitis, unspecified: Secondary | ICD-10-CM | POA: Diagnosis not present

## 2020-01-15 ENCOUNTER — Other Ambulatory Visit: Payer: Self-pay

## 2020-01-15 ENCOUNTER — Ambulatory Visit (INDEPENDENT_AMBULATORY_CARE_PROVIDER_SITE_OTHER): Payer: 59

## 2020-01-15 DIAGNOSIS — J309 Allergic rhinitis, unspecified: Secondary | ICD-10-CM

## 2020-01-24 ENCOUNTER — Other Ambulatory Visit: Payer: Self-pay

## 2020-01-24 ENCOUNTER — Ambulatory Visit (INDEPENDENT_AMBULATORY_CARE_PROVIDER_SITE_OTHER): Payer: 59

## 2020-01-24 DIAGNOSIS — J309 Allergic rhinitis, unspecified: Secondary | ICD-10-CM

## 2020-01-31 ENCOUNTER — Other Ambulatory Visit: Payer: Self-pay

## 2020-01-31 ENCOUNTER — Ambulatory Visit (INDEPENDENT_AMBULATORY_CARE_PROVIDER_SITE_OTHER): Payer: 59

## 2020-01-31 DIAGNOSIS — J309 Allergic rhinitis, unspecified: Secondary | ICD-10-CM | POA: Diagnosis not present

## 2020-02-13 IMAGING — DX RIGHT SHOULDER - 2+ VIEW
2 series · 2 of 2 positions shown · non-contrast
Comparison: None.

CLINICAL DATA: Right shoulder pain, limited range of motion

EXAM:
RIGHT SHOULDER - 2+ VIEW

[shoulder grashey]
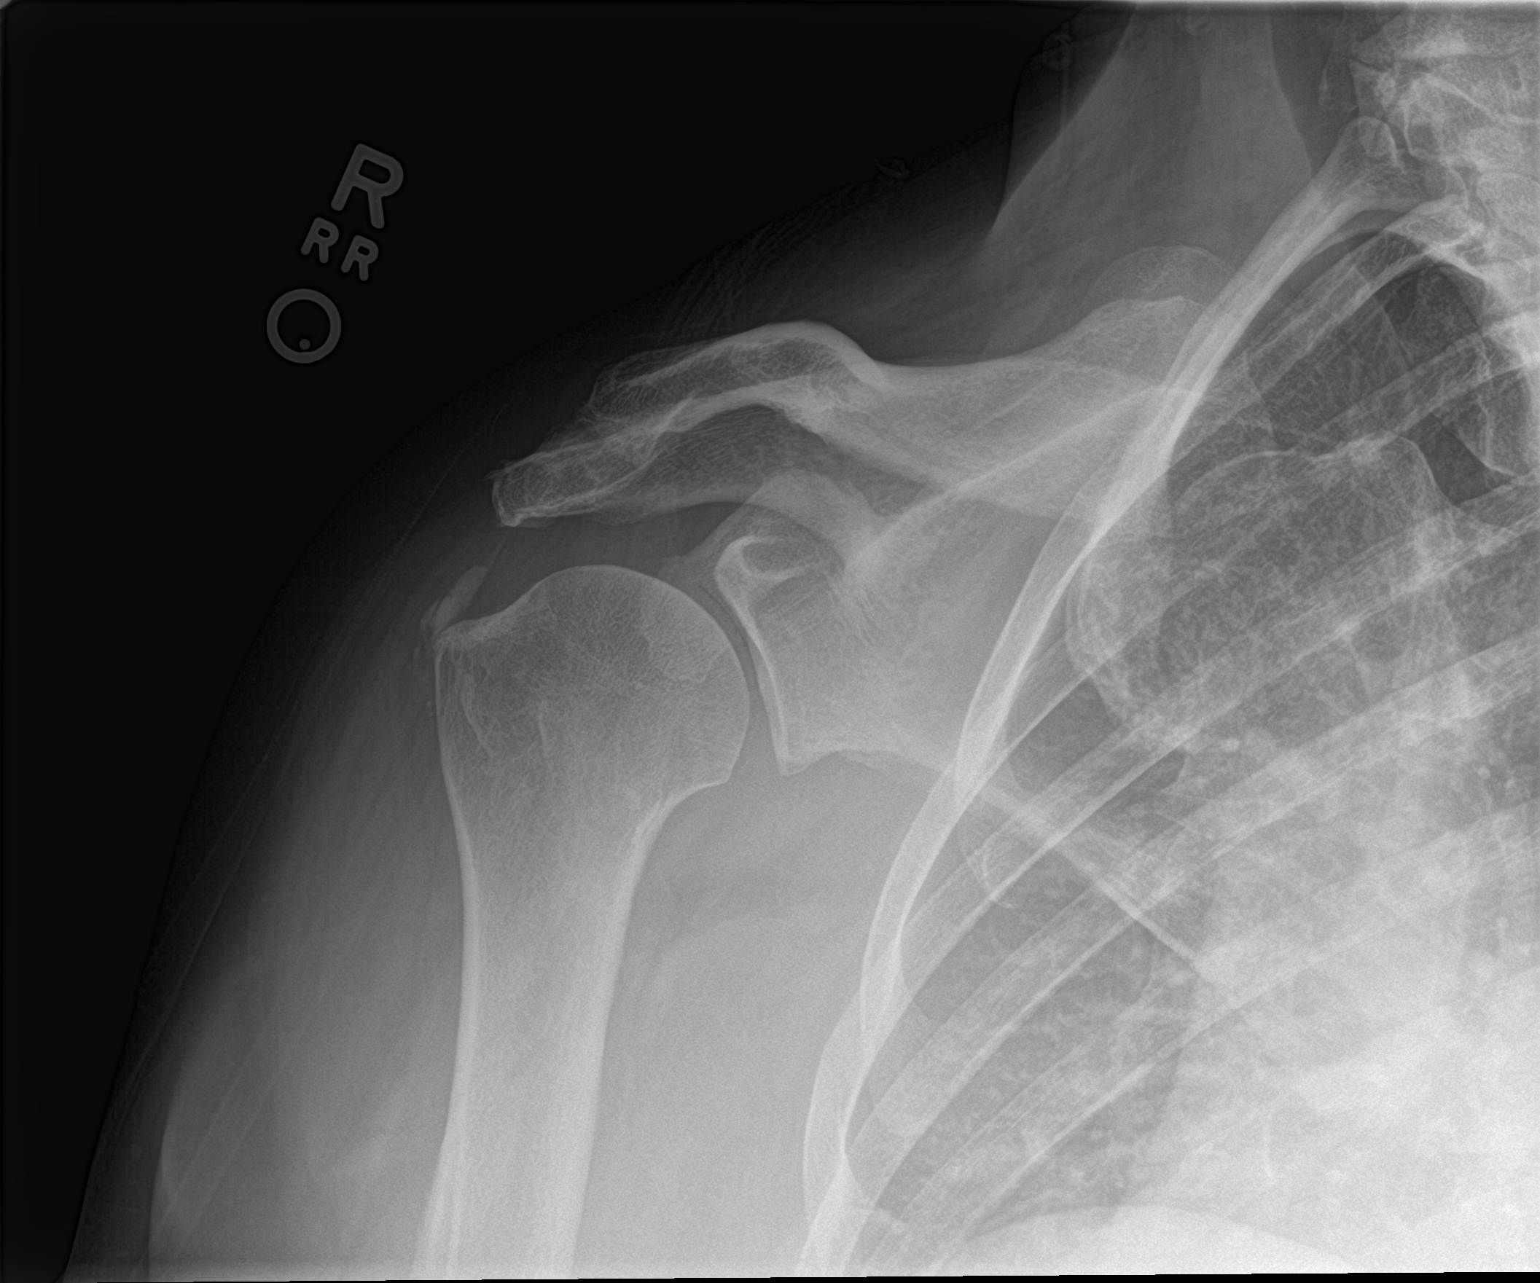

[shoulder y view]
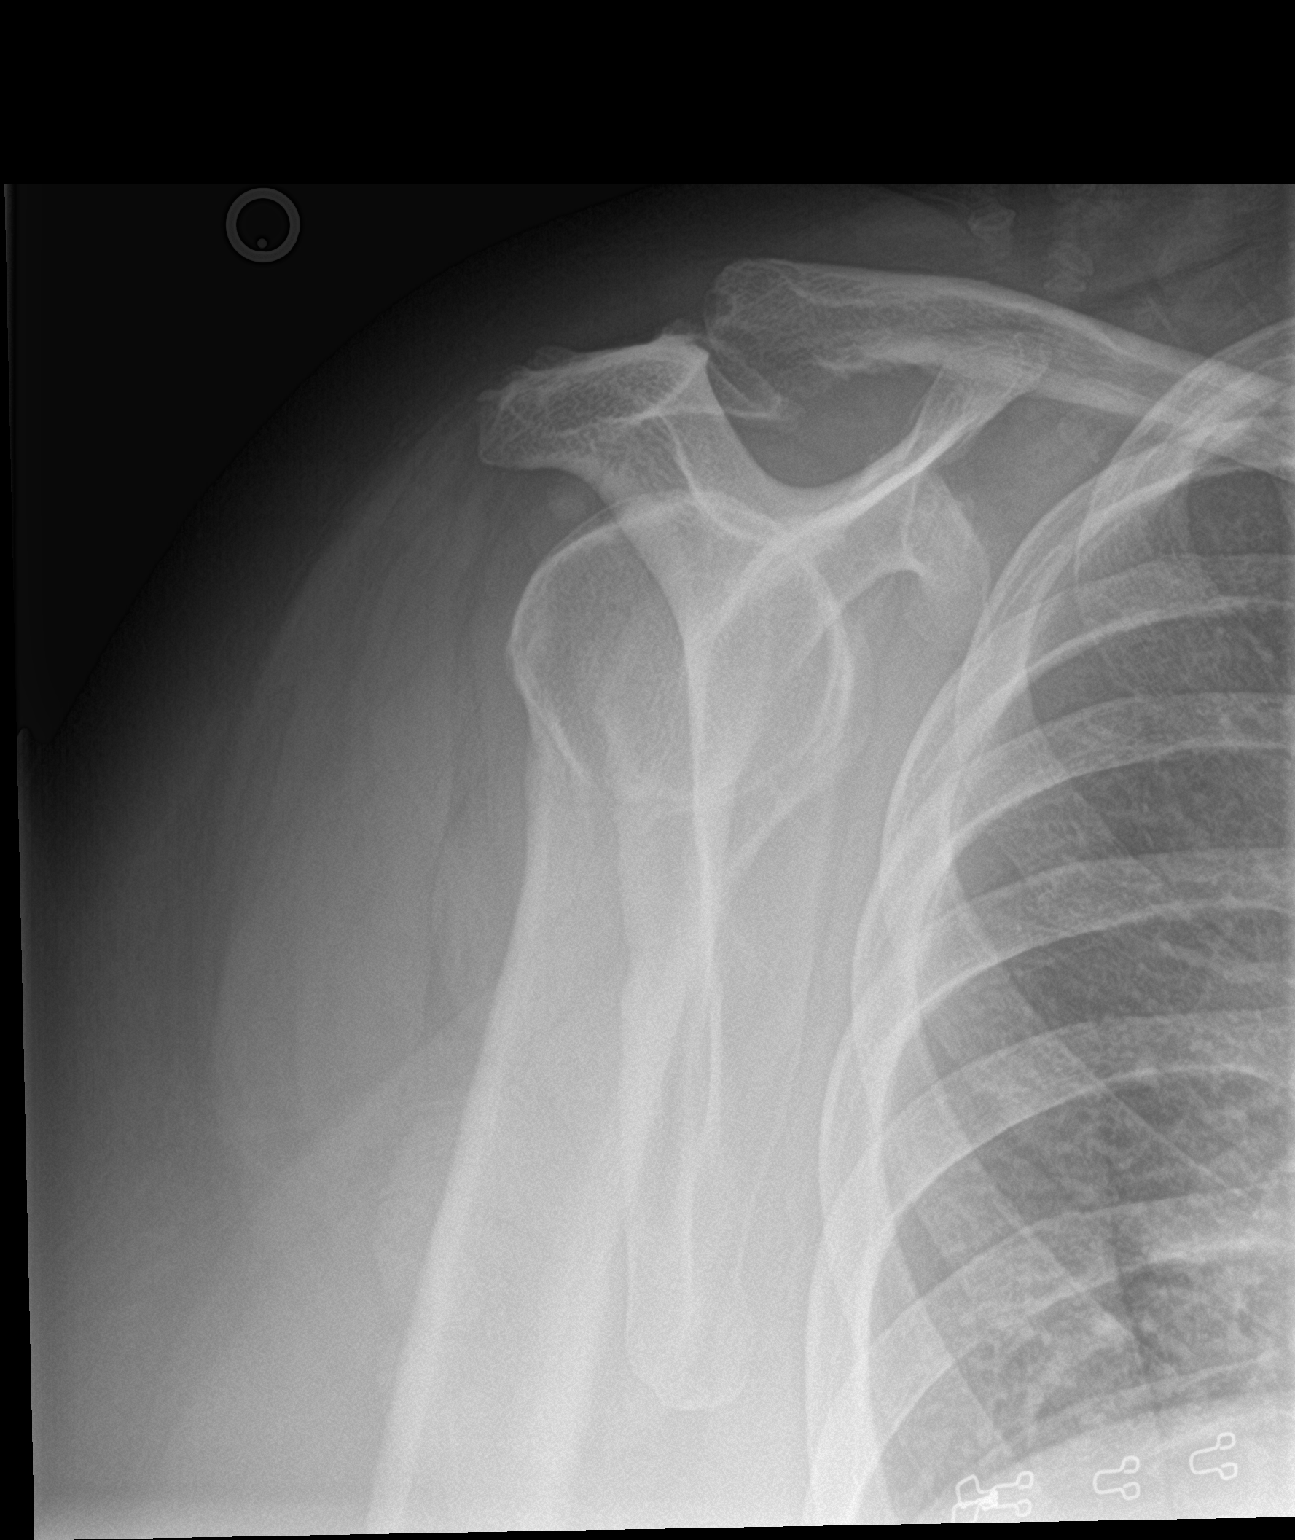

[2 of 2 positions shown; findings below may reference images not displayed]

FINDINGS: No acute fracture or malalignment. Glenohumeral joint space is
maintained. Mild AC joint arthropathy. Ovoid mineralized density at
the rotator cuff insertion suggesting hydroxy appetite deposition.
Visualized portion of the right lung is clear.
IMPRESSION: 1. No acute osseous findings.
2. Rotator cuff calcific tendinopathy.
3. Mild AC joint OA.

## 2020-02-14 ENCOUNTER — Ambulatory Visit (INDEPENDENT_AMBULATORY_CARE_PROVIDER_SITE_OTHER): Payer: 59

## 2020-02-14 ENCOUNTER — Other Ambulatory Visit: Payer: Self-pay

## 2020-02-14 DIAGNOSIS — J309 Allergic rhinitis, unspecified: Secondary | ICD-10-CM | POA: Diagnosis not present

## 2020-02-28 ENCOUNTER — Ambulatory Visit (INDEPENDENT_AMBULATORY_CARE_PROVIDER_SITE_OTHER): Payer: 59

## 2020-02-28 ENCOUNTER — Other Ambulatory Visit: Payer: Self-pay

## 2020-02-28 DIAGNOSIS — J309 Allergic rhinitis, unspecified: Secondary | ICD-10-CM | POA: Diagnosis not present

## 2020-03-13 ENCOUNTER — Other Ambulatory Visit: Payer: Self-pay

## 2020-03-13 ENCOUNTER — Ambulatory Visit (INDEPENDENT_AMBULATORY_CARE_PROVIDER_SITE_OTHER): Payer: 59

## 2020-03-13 DIAGNOSIS — J3089 Other allergic rhinitis: Secondary | ICD-10-CM

## 2020-03-13 DIAGNOSIS — J309 Allergic rhinitis, unspecified: Secondary | ICD-10-CM | POA: Diagnosis not present

## 2020-03-13 NOTE — Progress Notes (Signed)
Vials exp 03-13-21 °

## 2020-03-25 ENCOUNTER — Other Ambulatory Visit: Payer: Self-pay

## 2020-03-25 ENCOUNTER — Ambulatory Visit (INDEPENDENT_AMBULATORY_CARE_PROVIDER_SITE_OTHER): Payer: 59

## 2020-03-25 DIAGNOSIS — J309 Allergic rhinitis, unspecified: Secondary | ICD-10-CM

## 2020-04-08 ENCOUNTER — Other Ambulatory Visit: Payer: Self-pay

## 2020-04-08 ENCOUNTER — Ambulatory Visit (INDEPENDENT_AMBULATORY_CARE_PROVIDER_SITE_OTHER): Payer: 59

## 2020-04-08 DIAGNOSIS — J309 Allergic rhinitis, unspecified: Secondary | ICD-10-CM

## 2020-04-22 ENCOUNTER — Other Ambulatory Visit: Payer: Self-pay

## 2020-04-22 ENCOUNTER — Ambulatory Visit (INDEPENDENT_AMBULATORY_CARE_PROVIDER_SITE_OTHER): Payer: 59

## 2020-04-22 DIAGNOSIS — J309 Allergic rhinitis, unspecified: Secondary | ICD-10-CM | POA: Diagnosis not present

## 2020-05-08 ENCOUNTER — Ambulatory Visit (INDEPENDENT_AMBULATORY_CARE_PROVIDER_SITE_OTHER): Payer: 59

## 2020-05-08 ENCOUNTER — Other Ambulatory Visit: Payer: Self-pay

## 2020-05-08 DIAGNOSIS — J309 Allergic rhinitis, unspecified: Secondary | ICD-10-CM

## 2020-05-20 ENCOUNTER — Ambulatory Visit (INDEPENDENT_AMBULATORY_CARE_PROVIDER_SITE_OTHER): Payer: 59

## 2020-05-20 ENCOUNTER — Other Ambulatory Visit: Payer: Self-pay

## 2020-05-20 DIAGNOSIS — J309 Allergic rhinitis, unspecified: Secondary | ICD-10-CM | POA: Diagnosis not present

## 2020-05-27 ENCOUNTER — Other Ambulatory Visit: Payer: Self-pay

## 2020-05-27 ENCOUNTER — Ambulatory Visit (INDEPENDENT_AMBULATORY_CARE_PROVIDER_SITE_OTHER): Payer: 59

## 2020-05-27 DIAGNOSIS — J309 Allergic rhinitis, unspecified: Secondary | ICD-10-CM

## 2020-06-03 ENCOUNTER — Other Ambulatory Visit: Payer: Self-pay

## 2020-06-03 ENCOUNTER — Ambulatory Visit (INDEPENDENT_AMBULATORY_CARE_PROVIDER_SITE_OTHER): Payer: 59

## 2020-06-03 DIAGNOSIS — J309 Allergic rhinitis, unspecified: Secondary | ICD-10-CM

## 2020-06-05 ENCOUNTER — Ambulatory Visit (INDEPENDENT_AMBULATORY_CARE_PROVIDER_SITE_OTHER): Payer: 59

## 2020-06-05 ENCOUNTER — Other Ambulatory Visit: Payer: Self-pay

## 2020-06-05 DIAGNOSIS — Z23 Encounter for immunization: Secondary | ICD-10-CM | POA: Diagnosis not present

## 2020-06-10 ENCOUNTER — Other Ambulatory Visit: Payer: Self-pay

## 2020-06-10 ENCOUNTER — Ambulatory Visit (INDEPENDENT_AMBULATORY_CARE_PROVIDER_SITE_OTHER): Payer: 59

## 2020-06-10 DIAGNOSIS — J309 Allergic rhinitis, unspecified: Secondary | ICD-10-CM

## 2020-06-24 ENCOUNTER — Other Ambulatory Visit: Payer: Self-pay

## 2020-06-24 ENCOUNTER — Ambulatory Visit (INDEPENDENT_AMBULATORY_CARE_PROVIDER_SITE_OTHER): Payer: 59

## 2020-06-24 DIAGNOSIS — J309 Allergic rhinitis, unspecified: Secondary | ICD-10-CM | POA: Diagnosis not present

## 2020-07-08 ENCOUNTER — Ambulatory Visit (INDEPENDENT_AMBULATORY_CARE_PROVIDER_SITE_OTHER): Payer: 59

## 2020-07-08 ENCOUNTER — Other Ambulatory Visit: Payer: Self-pay

## 2020-07-08 DIAGNOSIS — J309 Allergic rhinitis, unspecified: Secondary | ICD-10-CM

## 2020-07-22 ENCOUNTER — Ambulatory Visit (INDEPENDENT_AMBULATORY_CARE_PROVIDER_SITE_OTHER): Payer: 59

## 2020-07-22 ENCOUNTER — Other Ambulatory Visit: Payer: Self-pay

## 2020-07-22 DIAGNOSIS — J309 Allergic rhinitis, unspecified: Secondary | ICD-10-CM

## 2020-07-23 ENCOUNTER — Emergency Department (INDEPENDENT_AMBULATORY_CARE_PROVIDER_SITE_OTHER)
Admission: EM | Admit: 2020-07-23 | Discharge: 2020-07-23 | Disposition: A | Discharge status: Home / Self Care | Payer: 59 | Source: Home / Self Care

## 2020-07-23 ENCOUNTER — Encounter: Payer: Self-pay | Admitting: Family Medicine

## 2020-07-23 DIAGNOSIS — S61411A Laceration without foreign body of right hand, initial encounter: Secondary | ICD-10-CM

## 2020-07-23 NOTE — ED Triage Notes (Signed)
Pt here for RT thumb laceration after cutting on broken glass about 45 mins ago. Throbbing pain. Bandaged by RN while registering. States up to date on tdap.

## 2020-07-23 NOTE — Discharge Instructions (Addendum)
Return in 1 week for removal of sutures.

## 2020-07-23 NOTE — ED Provider Notes (Signed)
[facility]    CSN: [id number] Arrival date & time: 07/23/20  1626      History   Chief Complaint Chief Complaint  Patient presents with  . Laceration    RT thumb    HPI Rachel Mercado is a 54 y.o. female.   [facility]ablished [facility] patient presents with right thumb laceration.  She cut her hand on glass when washing dishes an hour ago.  Last dT was < 2 years     Past Medical History:  Diagnosis Date  . Mild intermittent asthma 01/09/2018    Patient Active Problem List   Diagnosis Date Noted  . Food allergy 05/01/2019  . Allergic conjunctivitis 08/07/2018  . Mild intermittent asthma 01/09/2018  . Perennial and seasonal allergic rhinitis 01/09/2018    History reviewed. No pertinent surgical history.  OB History   No obstetric history on file.      Home Medications    Prior to Admission medications   Medication Sig Start Date End Date Taking? Authorizing Provider  Ferrous Sulfate Dried (FERROUS SULFATE IRON PO) Take by mouth.    [provider]  fluticasone (FLONASE) 50 MCG/ACT nasal spray Place 1 spray into both nostrils 2 (two) times daily as needed for allergies or rhinitis. 08/08/18   Bobbitt, Heywood Iles, MD  ibuprofen (ADVIL) 200 MG tablet Take 200 mg by mouth every 6 (six) hours as needed.    [provider]  levocetirizine (XYZAL) 5 MG tablet TAKE 1 TABLET BY MOUTH EVERY DAY IN THE EVENING 04/23/19   Bobbitt, Heywood Iles, MD  Menthol (ICY HOT ADVANCED RELIEF) 7.5 % PTCH Apply topically.    [provider]  Multiple Vitamin (MULTIVITAMIN) tablet Take 1 tablet by mouth daily.    [provider]  predniSONE (DELTASONE) 20 MG tablet Take one tab by mouth twice daily for 4 days, then one daily for 3 days. Take with food. 04/06/19   Lattie Haw, MD  Vitamin D, Ergocalciferol, (DRISDOL) 50000 units CAPS capsule Take 5,000 Units by mouth 1 day or 1 dose.    [provider]    Family History Family History   Problem Relation Age of Onset  . Hyperlipidemia Mother   . Hypertension Mother   . Heart failure Father   . Allergic rhinitis Neg Hx   . Asthma Neg Hx   . Eczema Neg Hx     Social History Social History   Tobacco Use  . Smoking status: Never Smoker  . Smokeless tobacco: Never Used  Vaping Use  . Vaping Use: Never used  Substance Use Topics  . Alcohol use: Never  . Drug use: Never     Allergies   Shellfish allergy   Review of Systems Review of Systems   Physical Exam Triage Vital Signs ED Triage Vitals  Enc Vitals Group     BP      Pulse      Resp      Temp      Temp src      SpO2      Weight      Height      Head Circumference      Peak Flow      Pain Score      Pain Loc      Pain Edu?      Excl. in GC?    No data found.  Updated Vital Signs BP 130/85 (BP Location: Left Arm)   Pulse 78   Temp  98.2 F (36.8 C) (Oral)   Resp 17   LMP  (LMP Unknown)   SpO2 97%    Physical Exam Vitals and nursing note reviewed.  Constitutional:      Appearance: Normal appearance. She is obese.  Eyes:     Conjunctiva/sclera: Conjunctivae normal.  Pulmonary:     Effort: Pulmonary effort is normal.  Musculoskeletal:        General: Normal range of motion.     Cervical back: Normal range of motion and neck supple.  Skin:    General: Skin is warm.     Findings: Lesion present.  Neurological:     General: No focal deficit present.     Mental Status: She is alert.  Psychiatric:        Mood and Affect: Mood normal.      UC Treatments / Results  Labs (all labs ordered are listed, but only abnormal results are displayed) Labs Reviewed - No data to display  EKG   Radiology No results found.  Procedures Laceration Repair  Date/Time: 07/23/2020 4:59 PM Performed by: Elvina Sidle, MD Authorized by: Elvina Sidle, MD   Consent:    Consent obtained:  Verbal   Consent given by:  Patient   Risks, benefits, and alternatives were discussed:  yes     Risks discussed:  Pain and infection Universal protocol:    Procedure explained and questions answered to patient or proxy's satisfaction: yes     Relevant documents present and verified: yes     Site/side marked: yes     Patient identity confirmed:  Verbally with patient Anesthesia:    Anesthesia method:  Local infiltration   Local anesthetic:  Lidocaine 2% WITH epi Laceration details:    Location:  Hand   Hand location:  R palm   Length (cm):  2.7   Depth (mm):  4 Pre-procedure details:    Preparation:  Patient was prepped and draped in usual sterile fashion Exploration:    Limited defect created (wound extended): no     Contaminated: no   Treatment:    Area cleansed with:  Povidone-iodine   Amount of cleaning:  Standard   Irrigation solution:  Sterile saline   Visualized foreign bodies/material removed: no     Debridement:  None   Undermining:  None   Scar revision: no     Layers/structures repaired:  Deep subcutaneous Deep subcutaneous:    Suture size:  4-0   Suture material:  Monocryl   Suture technique:  Horizontal mattress   Number of sutures:  2 Skin repair:    Repair method:  Sutures   Suture size:  4-0   Suture material:  Nylon   Suture technique:  Horizontal mattress   Number of sutures:  2 Approximation:    Approximation:  Close Repair type:    Repair type:  Intermediate Post-procedure details:    Dressing:  Bulky dressing   Procedure completion:  Tolerated   (including critical care time)  Medications Ordered in UC Medications - No data to display  Initial Impression / Assessment and Plan / UC Course  I have reviewed the triage vital signs and the nursing notes.  Pertinent labs & imaging results that were available during my care of the patient were reviewed by me and considered in my medical decision making (see chart for details).    Final Clinical Impressions(s) / UC Diagnoses   Final diagnoses:  Laceration of right hand without  foreign body, initial encounter  Discharge Instructions     Return in 1 week for removal of sutures.    ED Prescriptions    None     I have reviewed the PDMP during this encounter.   Elvina Sidle, MD 07/23/20 1701

## 2020-07-30 ENCOUNTER — Other Ambulatory Visit: Payer: Self-pay

## 2020-07-30 ENCOUNTER — Emergency Department
Admission: EM | Admit: 2020-07-30 | Discharge: 2020-07-30 | Disposition: A | Discharge status: Home / Self Care | Payer: 59 | Source: Home / Self Care

## 2020-07-30 DIAGNOSIS — Z4802 Encounter for removal of sutures: Secondary | ICD-10-CM

## 2020-07-30 DIAGNOSIS — J301 Allergic rhinitis due to pollen: Secondary | ICD-10-CM | POA: Diagnosis not present

## 2020-07-30 NOTE — ED Triage Notes (Signed)
Patient here for removal of two sutures from right hand. Wound healing well.

## 2020-07-30 NOTE — Progress Notes (Signed)
VIALS EXP 07-30-21 °

## 2020-08-06 DIAGNOSIS — J3089 Other allergic rhinitis: Secondary | ICD-10-CM | POA: Diagnosis not present

## 2020-08-06 NOTE — Progress Notes (Signed)
LABELS FOR CM-BILLING

## 2020-08-12 ENCOUNTER — Other Ambulatory Visit: Payer: Self-pay

## 2020-08-12 ENCOUNTER — Ambulatory Visit (INDEPENDENT_AMBULATORY_CARE_PROVIDER_SITE_OTHER): Payer: 59

## 2020-08-12 DIAGNOSIS — J309 Allergic rhinitis, unspecified: Secondary | ICD-10-CM | POA: Diagnosis not present

## 2020-08-28 ENCOUNTER — Ambulatory Visit (INDEPENDENT_AMBULATORY_CARE_PROVIDER_SITE_OTHER): Payer: 59

## 2020-08-28 ENCOUNTER — Other Ambulatory Visit: Payer: Self-pay

## 2020-08-28 DIAGNOSIS — J309 Allergic rhinitis, unspecified: Secondary | ICD-10-CM

## 2020-09-09 ENCOUNTER — Other Ambulatory Visit: Payer: Self-pay

## 2020-09-09 ENCOUNTER — Ambulatory Visit (INDEPENDENT_AMBULATORY_CARE_PROVIDER_SITE_OTHER): Payer: 59

## 2020-09-09 DIAGNOSIS — J309 Allergic rhinitis, unspecified: Secondary | ICD-10-CM | POA: Diagnosis not present

## 2020-09-23 ENCOUNTER — Other Ambulatory Visit: Payer: Self-pay

## 2020-09-23 ENCOUNTER — Ambulatory Visit (INDEPENDENT_AMBULATORY_CARE_PROVIDER_SITE_OTHER): Payer: 59

## 2020-09-23 DIAGNOSIS — J309 Allergic rhinitis, unspecified: Secondary | ICD-10-CM | POA: Diagnosis not present

## 2020-09-30 ENCOUNTER — Other Ambulatory Visit: Payer: Self-pay

## 2020-09-30 ENCOUNTER — Ambulatory Visit (INDEPENDENT_AMBULATORY_CARE_PROVIDER_SITE_OTHER): Payer: 59

## 2020-09-30 DIAGNOSIS — J309 Allergic rhinitis, unspecified: Secondary | ICD-10-CM

## 2020-10-09 ENCOUNTER — Ambulatory Visit (INDEPENDENT_AMBULATORY_CARE_PROVIDER_SITE_OTHER): Payer: 59

## 2020-10-09 ENCOUNTER — Other Ambulatory Visit: Payer: Self-pay

## 2020-10-09 DIAGNOSIS — J309 Allergic rhinitis, unspecified: Secondary | ICD-10-CM

## 2020-10-14 ENCOUNTER — Ambulatory Visit (INDEPENDENT_AMBULATORY_CARE_PROVIDER_SITE_OTHER): Payer: 59

## 2020-10-14 ENCOUNTER — Other Ambulatory Visit: Payer: Self-pay

## 2020-10-14 DIAGNOSIS — J309 Allergic rhinitis, unspecified: Secondary | ICD-10-CM

## 2020-10-21 ENCOUNTER — Ambulatory Visit (INDEPENDENT_AMBULATORY_CARE_PROVIDER_SITE_OTHER): Payer: 59

## 2020-10-21 ENCOUNTER — Other Ambulatory Visit: Payer: Self-pay

## 2020-10-21 DIAGNOSIS — J309 Allergic rhinitis, unspecified: Secondary | ICD-10-CM | POA: Diagnosis not present

## 2020-11-06 ENCOUNTER — Other Ambulatory Visit: Payer: Self-pay

## 2020-11-06 ENCOUNTER — Ambulatory Visit (INDEPENDENT_AMBULATORY_CARE_PROVIDER_SITE_OTHER): Payer: 59

## 2020-11-06 DIAGNOSIS — J309 Allergic rhinitis, unspecified: Secondary | ICD-10-CM

## 2020-11-18 ENCOUNTER — Ambulatory Visit (INDEPENDENT_AMBULATORY_CARE_PROVIDER_SITE_OTHER): Payer: 59

## 2020-11-18 ENCOUNTER — Other Ambulatory Visit: Payer: Self-pay

## 2020-11-18 DIAGNOSIS — J309 Allergic rhinitis, unspecified: Secondary | ICD-10-CM | POA: Diagnosis not present

## 2020-11-20 ENCOUNTER — Ambulatory Visit: Payer: 59 | Admitting: Allergy

## 2020-12-02 ENCOUNTER — Ambulatory Visit: Payer: 59 | Admitting: Allergy

## 2020-12-02 ENCOUNTER — Encounter: Payer: Self-pay | Admitting: Allergy

## 2020-12-02 ENCOUNTER — Other Ambulatory Visit: Payer: Self-pay

## 2020-12-02 VITALS — BP 110/70 | HR 85 | Temp 96.6°F | Resp 18 | Ht 72.0 in | Wt 372.4 lb

## 2020-12-02 DIAGNOSIS — T7800XD Anaphylactic reaction due to unspecified food, subsequent encounter: Secondary | ICD-10-CM

## 2020-12-02 DIAGNOSIS — J3089 Other allergic rhinitis: Secondary | ICD-10-CM | POA: Diagnosis not present

## 2020-12-02 DIAGNOSIS — J4520 Mild intermittent asthma, uncomplicated: Secondary | ICD-10-CM

## 2020-12-02 DIAGNOSIS — J309 Allergic rhinitis, unspecified: Secondary | ICD-10-CM

## 2020-12-02 MED ORDER — ALBUTEROL SULFATE HFA 108 (90 BASE) MCG/ACT IN AERS: 2.0000 | INHALATION_SPRAY | RESPIRATORY_TRACT | 1 refills | Status: AC | PRN

## 2020-12-02 MED ORDER — EPINEPHRINE 0.3 MG/0.3ML IJ SOAJ: 0.3000 mg | INTRAMUSCULAR | 1 refills | Status: DC | PRN

## 2020-12-02 NOTE — Assessment & Plan Note (Signed)
No reactions. Tolerates finned fish.  Continue careful avoidance of shellfish.  For mild symptoms you can take over the counter antihistamines such as Benadryl and monitor symptoms closely. If symptoms worsen or if you have severe symptoms including breathing issues, throat closure, significant swelling, whole body hives, severe diarrhea and vomiting, lightheadedness then inject epinephrine and seek immediate medical care afterwards.

## 2020-12-02 NOTE — Patient Instructions (Addendum)
Perennial and seasonal allergic rhinitis  Continue allergy injections - given today.  May use over the counter antihistamines such as Zyrtec (cetirizine), Claritin (loratadine), Allegra (fexofenadine), or Xyzal (levocetirizine) daily as needed.  May use Flonase (fluticasone) nasal spray 1 spray per nostril twice a day as needed for nasal congestion.   Nasal saline spray (i.e., Simply Saline) or nasal saline lavage (i.e., NeilMed) is recommended as needed and prior to medicated nasal sprays.  Mild intermittent asthma  May use albuterol rescue inhaler 2 puffs every 4 to 6 hours as needed for shortness of breath, chest tightness, coughing, and wheezing. May use albuterol rescue inhaler 2 puffs 5 to 15 minutes prior to strenuous physical activities. Monitor frequency of use.   Food allergy  Continue careful avoidance of shellfish.  For mild symptoms you can take over the counter antihistamines such as Benadryl and monitor symptoms closely. If symptoms worsen or if you have severe symptoms including breathing issues, throat closure, significant swelling, whole body hives, severe diarrhea and vomiting, lightheadedness then inject epinephrine and seek immediate medical care afterwards.  Follow up in 1 year or sooner if needed.

## 2020-12-02 NOTE — Assessment & Plan Note (Addendum)
Past history - on AIT in our office since 2019 ([employer]). 1 dog and 1 cat at home.  Interim history - stable.  Continue allergy injections - given today.  May use over the counter antihistamines such as Zyrtec (cetirizine), Claritin (loratadine), Allegra (fexofenadine), or Xyzal (levocetirizine) daily as needed.  May use Flonase (fluticasone) nasal spray 1 spray per nostril twice a day as needed for nasal congestion.   Nasal saline spray (i.e., Simply Saline) or nasal saline lavage (i.e., NeilMed) is recommended as needed and prior to medicated nasal sprays.  Continue environmental control measures.

## 2020-12-02 NOTE — Progress Notes (Signed)
Follow Up Note  RE: Rachel Mercado MRN: [id number] DOB: 16-Sep-1965 Date of Office Visit: 12/02/2020  Referring provider: Wilfred Curtis, MD Primary care provider: Wilfred Curtis, MD  Chief Complaint: Allergic Rhinitis   History of Present Illness: I had the pleasure of seeing Rachel Mercado for a follow up visit at the [facility] on 12/02/2020. She is a 55 y.o. female, who is being followed for allergic rhinitis on AIT, asthma and food allergy. Her previous allergy office visit was on 05/01/2019 with Dr. Nunzio Cobbs. Today is a regular follow up visit.  Perennial and seasonal allergic rhinitis Patient started allergy injections in [place] for about 1 year and then restarted in [place] in 2019 when she moved to this area. No issues with the injections.  She has noticed some improvement in symptoms but still has flares with fresh cut grass. She has 1 dog and 1 cat at home.  Currently taking allegra daily at night. No recent nasal sprays. Needs refill on Epipen.  Mild intermittent asthma Denies any SOB, coughing, wheezing, chest tightness, nocturnal awakenings, ER/urgent care visits or prednisone use since the last visit. No albuterol use the past year.   Food allergy Currently avoiding shellfish with no accidental ingestion. Tolerates finned fish.   Assessment and Plan: Rachel Mercado is a 55 y.o. female with: Perennial and seasonal allergic rhinitis Past history - on AIT in our office since 2019 ([employer]). 1 dog and 1 cat at home.  Interim history - stable.  Continue allergy injections - given today.  May use over the counter antihistamines such as Zyrtec (cetirizine), Claritin (loratadine), Allegra (fexofenadine), or Xyzal (levocetirizine) daily as needed.  May use Flonase (fluticasone) nasal spray 1 spray per nostril twice a day as needed for nasal congestion.   Nasal saline spray (i.e., Simply Saline) or nasal saline lavage (i.e., NeilMed) is recommended  as needed and prior to medicated nasal sprays.  Continue environmental control measures.   Mild intermittent asthma No albuterol use the last year.  May use albuterol rescue inhaler 2 puffs every 4 to 6 hours as needed for shortness of breath, chest tightness, coughing, and wheezing. May use albuterol rescue inhaler 2 puffs 5 to 15 minutes prior to strenuous physical activities. Monitor frequency of use.   Anaphylactic shock due to adverse food reaction No reactions. Tolerates finned fish.  Continue careful avoidance of shellfish.  For mild symptoms you can take over the counter antihistamines such as Benadryl and monitor symptoms closely. If symptoms worsen or if you have severe symptoms including breathing issues, throat closure, significant swelling, whole body hives, severe diarrhea and vomiting, lightheadedness then inject epinephrine and seek immediate medical care afterwards.  Return in about 1 year (around 12/02/2021).  Meds ordered this encounter  Medications  . albuterol (VENTOLIN HFA) 108 (90 Base) MCG/ACT inhaler    Sig: Inhale 2 puffs into the lungs every 4 (four) hours as needed for wheezing or shortness of breath (coughing fits).    Dispense:  8 g    Refill:  1  . EPINEPHrine (AUVI-Q) 0.3 mg/0.3 mL IJ SOAJ injection    Sig: Inject 0.3 mg into the muscle as needed for anaphylaxis.    Dispense:  1 each    Refill:  1    Phone: [phone]   Lab Orders  No laboratory test(s) ordered today    Diagnostics: None.  Medication List:  Current Outpatient Medications  Medication Sig Dispense Refill  . albuterol (VENTOLIN HFA) 108 (90  Base) MCG/ACT inhaler Inhale 2 puffs into the lungs every 4 (four) hours as needed for wheezing or shortness of breath (coughing fits). 8 g 1  . EPINEPHrine (AUVI-Q) 0.3 mg/0.3 mL IJ SOAJ injection Inject 0.3 mg into the muscle as needed for anaphylaxis. 1 each 1  . Ferrous Sulfate Dried (FERROUS SULFATE IRON PO) Take by mouth.    .  fexofenadine (ALLEGRA) 30 MG/5ML suspension Take 30 mg by mouth daily.    . fluticasone (FLONASE) 50 MCG/ACT nasal spray Place 1 spray into both nostrils 2 (two) times daily as needed for allergies or rhinitis. 16 g 3  . ibuprofen (ADVIL) 200 MG tablet Take 200 mg by mouth every 6 (six) hours as needed.    . Menthol (ICY HOT ADVANCED RELIEF) 7.5 % PTCH Apply topically.    . Multiple Vitamin (MULTIVITAMIN) tablet Take 1 tablet by mouth daily.    . NP THYROID 30 MG tablet Take 30 mg by mouth daily.    . Vitamin D, Ergocalciferol, (DRISDOL) 50000 units CAPS capsule Take 5,000 Units by mouth 1 day or 1 dose.     No current facility-administered medications for this visit.   Allergies: Allergies  Allergen Reactions  . Shellfish Allergy Hives   I reviewed her past medical history, social history, family history, and environmental history and no significant changes have been reported from her previous visit.  Review of Systems  Constitutional: Negative for appetite change, chills, fever and unexpected weight change.  HENT: Negative for congestion and rhinorrhea.   Eyes: Negative for itching.  Respiratory: Negative for cough, chest tightness, shortness of breath and wheezing.   Gastrointestinal: Negative for abdominal pain.  Skin: Negative for rash.  Allergic/Immunologic: Positive for environmental allergies and food allergies.  Neurological: Negative for headaches.   Objective: BP 110/70 (BP Location: Right Arm, Patient Position: Sitting, Cuff Size: Large)   Pulse 85   Temp (!) 96.6 F (35.9 C) (Temporal)   Resp 18   Ht 6' (1.829 m)   Wt (!) 372 lb 6.4 oz (168.9 kg)   LMP  (LMP Unknown)   SpO2 97%   BMI 50.51 kg/m  Body mass index is 50.51 kg/m. Physical Exam Vitals and nursing note reviewed.  Constitutional:      Appearance: Normal appearance. She is well-developed.  HENT:     Head: Normocephalic and atraumatic.     Right Ear: Tympanic membrane and external ear normal.      Left Ear: Tympanic membrane and external ear normal.     Nose: Nose normal.     Mouth/Throat:     Mouth: Mucous membranes are moist.     Pharynx: Oropharynx is clear.  Eyes:     Conjunctiva/sclera: Conjunctivae normal.  Cardiovascular:     Rate and Rhythm: Normal rate and regular rhythm.     Heart sounds: Normal heart sounds. No murmur heard.   Pulmonary:     Effort: Pulmonary effort is normal.     Breath sounds: Normal breath sounds. No wheezing, rhonchi or rales.  Musculoskeletal:     Cervical back: Neck supple.  Skin:    General: Skin is warm.     Findings: No rash.  Neurological:     Mental Status: She is alert and oriented to person, place, and time.  Psychiatric:        Behavior: Behavior normal.    Previous notes and tests were reviewed. The plan was reviewed with the patient/family, and all questions/concerned were addressed.  It was  my pleasure to see Rachel Mercado today and participate in her care. Please feel free to contact me with any questions or concerns.  Sincerely,  Wyline Mood, DO Allergy & Immunology  [facility] office: [phone] [facility] office: [phone]

## 2020-12-02 NOTE — Assessment & Plan Note (Signed)
No albuterol use the last year.  May use albuterol rescue inhaler 2 puffs every 4 to 6 hours as needed for shortness of breath, chest tightness, coughing, and wheezing. May use albuterol rescue inhaler 2 puffs 5 to 15 minutes prior to strenuous physical activities. Monitor frequency of use.

## 2020-12-09 DIAGNOSIS — J302 Other seasonal allergic rhinitis: Secondary | ICD-10-CM | POA: Diagnosis not present

## 2020-12-09 NOTE — Progress Notes (Addendum)
VIALS EXP 12-09-21.  ADDITIONAL LABEL FOR BILLING

## 2020-12-11 DIAGNOSIS — J3089 Other allergic rhinitis: Secondary | ICD-10-CM | POA: Diagnosis not present

## 2020-12-16 ENCOUNTER — Other Ambulatory Visit: Payer: Self-pay

## 2020-12-16 ENCOUNTER — Ambulatory Visit (INDEPENDENT_AMBULATORY_CARE_PROVIDER_SITE_OTHER): Payer: 59

## 2020-12-16 DIAGNOSIS — J309 Allergic rhinitis, unspecified: Secondary | ICD-10-CM | POA: Diagnosis not present

## 2020-12-30 ENCOUNTER — Ambulatory Visit (INDEPENDENT_AMBULATORY_CARE_PROVIDER_SITE_OTHER): Payer: 59

## 2020-12-30 ENCOUNTER — Other Ambulatory Visit: Payer: Self-pay

## 2020-12-30 DIAGNOSIS — J309 Allergic rhinitis, unspecified: Secondary | ICD-10-CM

## 2021-01-13 ENCOUNTER — Other Ambulatory Visit: Payer: Self-pay

## 2021-01-13 ENCOUNTER — Ambulatory Visit (INDEPENDENT_AMBULATORY_CARE_PROVIDER_SITE_OTHER): Payer: 59

## 2021-01-13 DIAGNOSIS — J309 Allergic rhinitis, unspecified: Secondary | ICD-10-CM

## 2021-01-27 ENCOUNTER — Other Ambulatory Visit: Payer: Self-pay

## 2021-01-27 ENCOUNTER — Ambulatory Visit (INDEPENDENT_AMBULATORY_CARE_PROVIDER_SITE_OTHER): Payer: 59

## 2021-01-27 DIAGNOSIS — J309 Allergic rhinitis, unspecified: Secondary | ICD-10-CM | POA: Diagnosis not present

## 2021-02-03 ENCOUNTER — Ambulatory Visit (INDEPENDENT_AMBULATORY_CARE_PROVIDER_SITE_OTHER): Payer: 59

## 2021-02-03 ENCOUNTER — Other Ambulatory Visit: Payer: Self-pay

## 2021-02-03 DIAGNOSIS — J309 Allergic rhinitis, unspecified: Secondary | ICD-10-CM | POA: Diagnosis not present

## 2021-02-10 ENCOUNTER — Ambulatory Visit (INDEPENDENT_AMBULATORY_CARE_PROVIDER_SITE_OTHER): Payer: 59

## 2021-02-10 ENCOUNTER — Other Ambulatory Visit: Payer: Self-pay

## 2021-02-10 DIAGNOSIS — J309 Allergic rhinitis, unspecified: Secondary | ICD-10-CM | POA: Diagnosis not present

## 2021-02-19 ENCOUNTER — Ambulatory Visit (INDEPENDENT_AMBULATORY_CARE_PROVIDER_SITE_OTHER): Payer: 59

## 2021-02-19 ENCOUNTER — Other Ambulatory Visit: Payer: Self-pay

## 2021-02-19 DIAGNOSIS — J309 Allergic rhinitis, unspecified: Secondary | ICD-10-CM | POA: Diagnosis not present

## 2021-03-03 ENCOUNTER — Ambulatory Visit (INDEPENDENT_AMBULATORY_CARE_PROVIDER_SITE_OTHER): Payer: 59

## 2021-03-03 ENCOUNTER — Other Ambulatory Visit: Payer: Self-pay

## 2021-03-03 DIAGNOSIS — J309 Allergic rhinitis, unspecified: Secondary | ICD-10-CM | POA: Diagnosis not present

## 2021-03-17 ENCOUNTER — Ambulatory Visit: Payer: 59 | Admitting: Allergy

## 2021-03-17 ENCOUNTER — Other Ambulatory Visit: Payer: Self-pay

## 2021-03-17 ENCOUNTER — Encounter: Payer: Self-pay | Admitting: Allergy

## 2021-03-17 VITALS — BP 118/70 | HR 81 | Temp 98.2°F | Resp 18 | Ht 73.03 in | Wt 350.0 lb

## 2021-03-17 DIAGNOSIS — J4520 Mild intermittent asthma, uncomplicated: Secondary | ICD-10-CM | POA: Diagnosis not present

## 2021-03-17 DIAGNOSIS — J3089 Other allergic rhinitis: Secondary | ICD-10-CM

## 2021-03-17 DIAGNOSIS — J309 Allergic rhinitis, unspecified: Secondary | ICD-10-CM | POA: Diagnosis not present

## 2021-03-17 DIAGNOSIS — T7800XD Anaphylactic reaction due to unspecified food, subsequent encounter: Secondary | ICD-10-CM

## 2021-03-17 NOTE — Patient Instructions (Addendum)
Food Discussed with patient that skin prick testing and bloodwork (food IgE levels) check for IgE mediated reactions and elevated inflammation markers are not part the IgE mediated pathway. You can send me the lab results as well and I will look over them. No indication for any skin testing today.   Keep a food journal with symptoms and foods eaten.  Continue to avoid shellfish and pork. For mild symptoms you can take over the counter antihistamines such as Benadryl and monitor symptoms closely. If symptoms worsen or if you have severe symptoms including breathing issues, throat closure, significant swelling, whole body hives, severe diarrhea and vomiting, lightheadedness then inject epinephrine and seek immediate medical care afterwards. Action plan in place.   Perennial and seasonal allergic rhinitis Continue allergy injections - given today. May use over the counter antihistamines such as Zyrtec (cetirizine), Claritin (loratadine), Allegra (fexofenadine), or Xyzal (levocetirizine) daily as needed. May use Flonase (fluticasone) nasal spray 1 spray per nostril twice a day as needed for nasal congestion.  Nasal saline spray (i.e., Simply Saline) or nasal saline lavage (i.e., NeilMed) is recommended as needed and prior to medicated nasal sprays.   Mild intermittent asthma May use albuterol rescue inhaler 2 puffs every 4 to 6 hours as needed for shortness of breath, chest tightness, coughing, and wheezing. May use albuterol rescue inhaler 2 puffs 5 to 15 minutes prior to strenuous physical activities. Monitor frequency of use.   Follow up in 1 year or sooner if needed.

## 2021-03-17 NOTE — Assessment & Plan Note (Signed)
Past history - on AIT in our office since 2019 ([employer]). 1 dog and 1 cat at home.  Interim history - controlled.  Continue allergy injections - given today.  May use over the counter antihistamines such as Zyrtec (cetirizine), Claritin (loratadine), Allegra (fexofenadine), or Xyzal (levocetirizine) daily as needed.  May use Flonase (fluticasone) nasal spray 1 spray per nostril twice a day as needed for nasal congestion.   Nasal saline spray (i.e., Simply Saline) or nasal saline lavage (i.e., NeilMed) is recommended as needed and prior to medicated nasal sprays.  Continue environmental control measures.

## 2021-03-17 NOTE — Assessment & Plan Note (Addendum)
Past history - shellfish and pork caused hives. One episode of itching/hives with [facility]. Tolerates finned fish. Interim history - concerned if there are any foods that she is eating that may be causing elevation in her inflammatory markers. She is going to a weight loss clinic and was told to avoid certain foods due to this. No clinical reactions though.  Discussed with patient that skin prick testing and bloodwork (food IgE levels) check for IgE mediated reactions and elevated inflammation markers are not part the IgE mediated pathway.  You can send me the lab results as well and I will look over them.  No indication for any skin testing today.   Keep a food journal with symptoms and foods eaten. . Continue to avoid shellfish and pork. . For mild symptoms you can take over the counter antihistamines such as Benadryl and monitor symptoms closely. If symptoms worsen or if you have severe symptoms including breathing issues, throat closure, significant swelling, whole body hives, severe diarrhea and vomiting, lightheadedness then inject epinephrine and seek immediate medical care afterwards. . Action plan in place.

## 2021-03-17 NOTE — Assessment & Plan Note (Signed)
Well-controlled.  ?? May use albuterol rescue inhaler 2 puffs every 4 to 6 hours as needed for shortness of breath, chest tightness, coughing, and wheezing. May use albuterol rescue inhaler 2 puffs 5 to 15 minutes prior to strenuous physical activities. Monitor frequency of use.  ?

## 2021-03-17 NOTE — Progress Notes (Signed)
Follow Up Note  RE: Rachel Mercado MRN: [id number] DOB: 1965-12-11 Date of Office Visit: 03/17/2021  Referring provider: Wilfred Curtis, MD Primary care provider: Wilfred Curtis, MD  Chief Complaint: Allergy Testing (Food testing)  History of Present Illness: I had the pleasure of seeing Rachel Mercado for a follow up visit at the [facility] on 03/17/2021. She is a 55 y.o. female, who is being followed for allergic rhinitis on AIT, asthma and food allergy. Her previous allergy office visit was on 12/02/2020 with Dr. Selena Batten. Today is a regular follow up visit.  Patient is being followed in a weight loss clinic by a nurse practitioner and was found to have underactive thyroid. She is on NP thyroid medication now and her levels are improving per patient report.  Apparently during bloodwork she was also found to have some elevation in her inflammatory markers. Patient is not sure of the specific labs.  She was recommended to start avoiding certain foods such as grains and rice which she previously tolerated without any issues.  Patient wants to know what specific foods are causing her inflammation and if we can do skin testing to find out so she can eliminate them from her diet.  She had gastric bypass surgery many years ago and lost 100lbs but then gained 50+lbs back and now trying to lose it again.   Shellfish and pork causes hives. Mahi mahi caused some itching/hives - but patient not sure if it was a cross contamination issues.   Dietary History: patient has been eating other foods including milk, eggs, peanut, treenuts, sesame, fish, soy, wheat, meats, fruits and vegetables without any issues.  Perennial and seasonal allergic rhinitis Doing well with allergy injections.  Mild intermittent asthma Stable.    Assessment and Plan: Rachel Mercado is a 55 y.o. female with: Anaphylactic shock due to adverse food reaction Past history - shellfish and pork caused hives. One episode  of itching/hives with [facility]. Tolerates finned fish. Interim history - concerned if there are any foods that she is eating that may be causing elevation in her inflammatory markers. She is going to a weight loss clinic and was told to avoid certain foods due to this. No clinical reactions though. Discussed with patient that skin prick testing and bloodwork (food IgE levels) check for IgE mediated reactions and elevated inflammation markers are not part the IgE mediated pathway. You can send me the lab results as well and I will look over them. No indication for any skin testing today.  Keep a food journal with symptoms and foods eaten. Continue to avoid shellfish and pork. For mild symptoms you can take over the counter antihistamines such as Benadryl and monitor symptoms closely. If symptoms worsen or if you have severe symptoms including breathing issues, throat closure, significant swelling, whole body hives, severe diarrhea and vomiting, lightheadedness then inject epinephrine and seek immediate medical care afterwards. Action plan in place.  Perennial and seasonal allergic rhinitis Past history - on AIT in our office since 2019 ([employer]). 1 dog and 1 cat at home.  Interim history - controlled. Continue allergy injections - given today. May use over the counter antihistamines such as Zyrtec (cetirizine), Claritin (loratadine), Allegra (fexofenadine), or Xyzal (levocetirizine) daily as needed. May use Flonase (fluticasone) nasal spray 1 spray per nostril twice a day as needed for nasal congestion.  Nasal saline spray (i.e., Simply Saline) or nasal saline lavage (i.e., NeilMed) is recommended as needed and prior to  medicated nasal sprays. Continue environmental control measures.   Mild intermittent asthma Well-controlled. May use albuterol rescue inhaler 2 puffs every 4 to 6 hours as needed for shortness of breath, chest tightness, coughing, and wheezing. May use  albuterol rescue inhaler 2 puffs 5 to 15 minutes prior to strenuous physical activities. Monitor frequency of use.   Return in about 1 year (around 03/17/2022).  No orders of the defined types were placed in this encounter.  Lab Orders  No laboratory test(s) ordered today    Diagnostics: None.  Medication List:  Current Outpatient Medications  Medication Sig Dispense Refill   albuterol (VENTOLIN HFA) 108 (90 Base) MCG/ACT inhaler Inhale 2 puffs into the lungs every 4 (four) hours as needed for wheezing or shortness of breath (coughing fits). 8 g 1   EPINEPHrine (AUVI-Q) 0.3 mg/0.3 mL IJ SOAJ injection Inject 0.3 mg into the muscle as needed for anaphylaxis. 1 each 1   Ferrous Sulfate Dried (FERROUS SULFATE IRON PO) Take by mouth.     fexofenadine (ALLEGRA) 30 MG/5ML suspension Take 30 mg by mouth daily.     fluticasone (FLONASE) 50 MCG/ACT nasal spray Place 1 spray into both nostrils 2 (two) times daily as needed for allergies or rhinitis. 16 g 3   Menthol (ICY HOT ADVANCED RELIEF) 7.5 % PTCH Apply topically.     Multiple Vitamin (MULTIVITAMIN) tablet Take 1 tablet by mouth daily.     NP THYROID 30 MG tablet Take 30 mg by mouth daily.     Vitamin D, Ergocalciferol, (DRISDOL) 50000 units CAPS capsule Take 5,000 Units by mouth 1 day or 1 dose.     No current facility-administered medications for this visit.   Allergies: Allergies  Allergen Reactions   Other Shortness Of Breath    TREES MOLD CATS DOGS DUST MITES   Aspirin Other (See Comments)    CAN NOT TAKE D/T H/O GASTRIC BYPASS CAN NOT TAKE D/T H/O GASTRIC BYPASS    Ibuprofen Other (See Comments)    CAN NOT TAKE D/T H/O GASTRIC BYPASS CAN NOT TAKE D/T H/O GASTRIC BYPASS    Shellfish Allergy Hives   I reviewed her past medical history, social history, family history, and environmental history and no significant changes have been reported from her previous visit.  Review of Systems  Constitutional:  Negative for appetite  change, chills, fever and unexpected weight change.  HENT:  Negative for congestion and rhinorrhea.   Eyes:  Negative for itching.  Respiratory:  Negative for cough, chest tightness, shortness of breath and wheezing.   Gastrointestinal:  Negative for abdominal pain.  Skin:  Negative for rash.  Allergic/Immunologic: Positive for environmental allergies and food allergies.  Neurological:  Negative for headaches.   Objective: BP 118/70 (BP Location: Left Arm, Patient Position: Sitting, Cuff Size: Large)   Pulse 81   Temp 98.2 F (36.8 C) (Temporal)   Resp 18   Ht 6' 1.03" (1.855 m)   Wt (!) 350 lb (158.8 kg)   LMP  (LMP Unknown)   SpO2 98%   BMI 46.14 kg/m  Body mass index is 46.14 kg/m. Physical Exam Vitals and nursing note reviewed.  Constitutional:      Appearance: Normal appearance. She is well-developed.  HENT:     Head: Normocephalic and atraumatic.     Right Ear: Tympanic membrane and external ear normal.     Left Ear: Tympanic membrane and external ear normal.     Nose: Nose normal.     Mouth/Throat:  Mouth: Mucous membranes are moist.     Pharynx: Oropharynx is clear.  Eyes:     Conjunctiva/sclera: Conjunctivae normal.  Cardiovascular:     Rate and Rhythm: Normal rate and regular rhythm.     Heart sounds: Normal heart sounds. No murmur heard. Pulmonary:     Effort: Pulmonary effort is normal.     Breath sounds: Normal breath sounds. No wheezing, rhonchi or rales.  Musculoskeletal:     Cervical back: Neck supple.  Skin:    General: Skin is warm.     Findings: No rash.  Neurological:     Mental Status: She is alert and oriented to person, place, and time.  Psychiatric:        Behavior: Behavior normal.   Previous notes and tests were reviewed. The plan was reviewed with the patient/family, and all questions/concerned were addressed.  It was my pleasure to see Kayse today and participate in her care. Please feel free to contact me with any questions or  concerns.  Sincerely,  Wyline Mood, DO Allergy & Immunology  [facility] office: [phone] [facility] office: [phone]

## 2021-04-07 ENCOUNTER — Ambulatory Visit: Payer: 59

## 2021-04-07 ENCOUNTER — Other Ambulatory Visit: Payer: Self-pay

## 2021-04-09 ENCOUNTER — Other Ambulatory Visit: Payer: Self-pay

## 2021-04-09 ENCOUNTER — Ambulatory Visit (INDEPENDENT_AMBULATORY_CARE_PROVIDER_SITE_OTHER): Payer: 59

## 2021-04-09 DIAGNOSIS — J309 Allergic rhinitis, unspecified: Secondary | ICD-10-CM | POA: Diagnosis not present

## 2021-04-30 ENCOUNTER — Ambulatory Visit (INDEPENDENT_AMBULATORY_CARE_PROVIDER_SITE_OTHER): Payer: 59

## 2021-04-30 ENCOUNTER — Other Ambulatory Visit: Payer: Self-pay

## 2021-04-30 DIAGNOSIS — J309 Allergic rhinitis, unspecified: Secondary | ICD-10-CM

## 2021-05-06 DIAGNOSIS — J301 Allergic rhinitis due to pollen: Secondary | ICD-10-CM

## 2021-05-06 NOTE — Progress Notes (Signed)
VIALS MADE. EXP 05-06-22 

## 2021-05-19 ENCOUNTER — Ambulatory Visit (INDEPENDENT_AMBULATORY_CARE_PROVIDER_SITE_OTHER): Payer: 59

## 2021-05-19 ENCOUNTER — Other Ambulatory Visit: Payer: Self-pay

## 2021-05-19 DIAGNOSIS — J309 Allergic rhinitis, unspecified: Secondary | ICD-10-CM | POA: Diagnosis not present

## 2021-06-08 DIAGNOSIS — J3089 Other allergic rhinitis: Secondary | ICD-10-CM

## 2021-06-09 ENCOUNTER — Ambulatory Visit (INDEPENDENT_AMBULATORY_CARE_PROVIDER_SITE_OTHER): Payer: 59

## 2021-06-09 ENCOUNTER — Other Ambulatory Visit: Payer: Self-pay

## 2021-06-09 DIAGNOSIS — J309 Allergic rhinitis, unspecified: Secondary | ICD-10-CM | POA: Diagnosis not present

## 2021-07-07 ENCOUNTER — Other Ambulatory Visit: Payer: Self-pay

## 2021-07-07 ENCOUNTER — Ambulatory Visit (INDEPENDENT_AMBULATORY_CARE_PROVIDER_SITE_OTHER): Payer: 59

## 2021-07-07 DIAGNOSIS — J309 Allergic rhinitis, unspecified: Secondary | ICD-10-CM | POA: Diagnosis not present

## 2021-07-21 ENCOUNTER — Other Ambulatory Visit: Payer: Self-pay

## 2021-07-21 ENCOUNTER — Ambulatory Visit (INDEPENDENT_AMBULATORY_CARE_PROVIDER_SITE_OTHER): Payer: 59

## 2021-07-21 DIAGNOSIS — J309 Allergic rhinitis, unspecified: Secondary | ICD-10-CM

## 2021-07-28 ENCOUNTER — Ambulatory Visit (INDEPENDENT_AMBULATORY_CARE_PROVIDER_SITE_OTHER): Payer: 59

## 2021-07-28 ENCOUNTER — Other Ambulatory Visit: Payer: Self-pay

## 2021-07-28 DIAGNOSIS — J309 Allergic rhinitis, unspecified: Secondary | ICD-10-CM

## 2021-08-04 ENCOUNTER — Ambulatory Visit (INDEPENDENT_AMBULATORY_CARE_PROVIDER_SITE_OTHER): Payer: 59

## 2021-08-04 ENCOUNTER — Other Ambulatory Visit: Payer: Self-pay

## 2021-08-04 DIAGNOSIS — J309 Allergic rhinitis, unspecified: Secondary | ICD-10-CM | POA: Diagnosis not present

## 2021-08-11 ENCOUNTER — Ambulatory Visit (INDEPENDENT_AMBULATORY_CARE_PROVIDER_SITE_OTHER): Payer: 59

## 2021-08-11 ENCOUNTER — Other Ambulatory Visit: Payer: Self-pay

## 2021-08-11 DIAGNOSIS — J309 Allergic rhinitis, unspecified: Secondary | ICD-10-CM

## 2021-08-18 ENCOUNTER — Ambulatory Visit (INDEPENDENT_AMBULATORY_CARE_PROVIDER_SITE_OTHER): Payer: 59

## 2021-08-18 ENCOUNTER — Other Ambulatory Visit: Payer: Self-pay

## 2021-08-18 DIAGNOSIS — J309 Allergic rhinitis, unspecified: Secondary | ICD-10-CM | POA: Diagnosis not present

## 2021-09-08 ENCOUNTER — Other Ambulatory Visit: Payer: Self-pay

## 2021-09-08 ENCOUNTER — Ambulatory Visit (INDEPENDENT_AMBULATORY_CARE_PROVIDER_SITE_OTHER): Payer: 59

## 2021-09-08 DIAGNOSIS — J309 Allergic rhinitis, unspecified: Secondary | ICD-10-CM

## 2021-09-29 ENCOUNTER — Ambulatory Visit (INDEPENDENT_AMBULATORY_CARE_PROVIDER_SITE_OTHER): Payer: 59

## 2021-09-29 ENCOUNTER — Other Ambulatory Visit: Payer: Self-pay

## 2021-09-29 DIAGNOSIS — J309 Allergic rhinitis, unspecified: Secondary | ICD-10-CM

## 2021-10-20 ENCOUNTER — Other Ambulatory Visit: Payer: Self-pay

## 2021-10-20 ENCOUNTER — Ambulatory Visit (INDEPENDENT_AMBULATORY_CARE_PROVIDER_SITE_OTHER): Payer: 59

## 2021-10-20 DIAGNOSIS — J309 Allergic rhinitis, unspecified: Secondary | ICD-10-CM | POA: Diagnosis not present

## 2021-11-02 DIAGNOSIS — J301 Allergic rhinitis due to pollen: Secondary | ICD-10-CM | POA: Diagnosis not present

## 2021-11-02 NOTE — Progress Notes (Signed)
VIALS EXP 11-03-22 ?

## 2021-11-03 DIAGNOSIS — J3089 Other allergic rhinitis: Secondary | ICD-10-CM | POA: Diagnosis not present

## 2021-11-10 ENCOUNTER — Ambulatory Visit (INDEPENDENT_AMBULATORY_CARE_PROVIDER_SITE_OTHER): Payer: 59

## 2021-11-10 DIAGNOSIS — J309 Allergic rhinitis, unspecified: Secondary | ICD-10-CM

## 2021-12-01 ENCOUNTER — Ambulatory Visit (INDEPENDENT_AMBULATORY_CARE_PROVIDER_SITE_OTHER): Payer: 59

## 2021-12-01 DIAGNOSIS — J309 Allergic rhinitis, unspecified: Secondary | ICD-10-CM

## 2021-12-22 ENCOUNTER — Ambulatory Visit (INDEPENDENT_AMBULATORY_CARE_PROVIDER_SITE_OTHER): Payer: 59 | Admitting: *Deleted

## 2021-12-22 DIAGNOSIS — J309 Allergic rhinitis, unspecified: Secondary | ICD-10-CM | POA: Diagnosis not present

## 2021-12-31 ENCOUNTER — Ambulatory Visit (INDEPENDENT_AMBULATORY_CARE_PROVIDER_SITE_OTHER): Payer: 59

## 2021-12-31 DIAGNOSIS — J309 Allergic rhinitis, unspecified: Secondary | ICD-10-CM | POA: Diagnosis not present

## 2022-01-12 ENCOUNTER — Ambulatory Visit (INDEPENDENT_AMBULATORY_CARE_PROVIDER_SITE_OTHER): Payer: 59

## 2022-01-12 DIAGNOSIS — J309 Allergic rhinitis, unspecified: Secondary | ICD-10-CM | POA: Diagnosis not present

## 2022-01-21 ENCOUNTER — Ambulatory Visit (INDEPENDENT_AMBULATORY_CARE_PROVIDER_SITE_OTHER): Payer: 59

## 2022-01-21 DIAGNOSIS — J309 Allergic rhinitis, unspecified: Secondary | ICD-10-CM | POA: Diagnosis not present

## 2022-02-04 ENCOUNTER — Ambulatory Visit (INDEPENDENT_AMBULATORY_CARE_PROVIDER_SITE_OTHER): Payer: 59

## 2022-02-04 ENCOUNTER — Telehealth: Payer: Self-pay | Admitting: Allergy

## 2022-02-04 DIAGNOSIS — J309 Allergic rhinitis, unspecified: Secondary | ICD-10-CM

## 2022-02-04 MED ORDER — AUVI-Q 0.3 MG/0.3ML IJ SOAJ: 0.3000 mg | INTRAMUSCULAR | 1 refills | Status: AC | PRN

## 2022-02-04 NOTE — Telephone Encounter (Signed)
Rachel Mercado came into the office today to receive her injection and stated her Audry Riles is expired and is in need of refills.  Please advise.

## 2022-02-04 NOTE — Telephone Encounter (Signed)
I sent in a refill but pt is due for a follow up in aug can you please call pt and get her scheduled for a yearly follow up in aug thank you

## 2022-02-11 ENCOUNTER — Ambulatory Visit (INDEPENDENT_AMBULATORY_CARE_PROVIDER_SITE_OTHER): Payer: 59

## 2022-02-11 DIAGNOSIS — J309 Allergic rhinitis, unspecified: Secondary | ICD-10-CM

## 2022-03-04 ENCOUNTER — Ambulatory Visit (INDEPENDENT_AMBULATORY_CARE_PROVIDER_SITE_OTHER): Payer: 59

## 2022-03-04 DIAGNOSIS — J309 Allergic rhinitis, unspecified: Secondary | ICD-10-CM

## 2022-03-30 ENCOUNTER — Ambulatory Visit (INDEPENDENT_AMBULATORY_CARE_PROVIDER_SITE_OTHER): Payer: 59

## 2022-03-30 DIAGNOSIS — J309 Allergic rhinitis, unspecified: Secondary | ICD-10-CM

## 2022-04-15 DIAGNOSIS — J3089 Other allergic rhinitis: Secondary | ICD-10-CM | POA: Diagnosis not present

## 2022-04-15 NOTE — Progress Notes (Signed)
VIALS EXP 04-16-23 

## 2022-04-29 ENCOUNTER — Ambulatory Visit (INDEPENDENT_AMBULATORY_CARE_PROVIDER_SITE_OTHER): Payer: 59

## 2022-04-29 DIAGNOSIS — J309 Allergic rhinitis, unspecified: Secondary | ICD-10-CM | POA: Diagnosis not present

## 2022-05-27 ENCOUNTER — Ambulatory Visit (INDEPENDENT_AMBULATORY_CARE_PROVIDER_SITE_OTHER): Payer: 59

## 2022-05-27 DIAGNOSIS — J309 Allergic rhinitis, unspecified: Secondary | ICD-10-CM | POA: Diagnosis not present

## 2022-06-24 ENCOUNTER — Ambulatory Visit (INDEPENDENT_AMBULATORY_CARE_PROVIDER_SITE_OTHER): Payer: 59

## 2022-06-24 DIAGNOSIS — J309 Allergic rhinitis, unspecified: Secondary | ICD-10-CM

## 2022-07-22 ENCOUNTER — Ambulatory Visit (INDEPENDENT_AMBULATORY_CARE_PROVIDER_SITE_OTHER): Payer: 59

## 2022-07-22 DIAGNOSIS — J309 Allergic rhinitis, unspecified: Secondary | ICD-10-CM | POA: Diagnosis not present

## 2022-07-29 ENCOUNTER — Ambulatory Visit (INDEPENDENT_AMBULATORY_CARE_PROVIDER_SITE_OTHER): Payer: 59

## 2022-07-29 DIAGNOSIS — J309 Allergic rhinitis, unspecified: Secondary | ICD-10-CM

## 2022-08-10 ENCOUNTER — Ambulatory Visit (INDEPENDENT_AMBULATORY_CARE_PROVIDER_SITE_OTHER): Payer: 59 | Admitting: *Deleted

## 2022-08-10 DIAGNOSIS — J309 Allergic rhinitis, unspecified: Secondary | ICD-10-CM

## 2022-08-19 ENCOUNTER — Ambulatory Visit (INDEPENDENT_AMBULATORY_CARE_PROVIDER_SITE_OTHER): Payer: 59

## 2022-08-19 DIAGNOSIS — J309 Allergic rhinitis, unspecified: Secondary | ICD-10-CM | POA: Diagnosis not present

## 2022-08-24 ENCOUNTER — Ambulatory Visit (INDEPENDENT_AMBULATORY_CARE_PROVIDER_SITE_OTHER): Payer: 59

## 2022-08-24 DIAGNOSIS — J309 Allergic rhinitis, unspecified: Secondary | ICD-10-CM | POA: Diagnosis not present

## 2022-09-21 ENCOUNTER — Ambulatory Visit (INDEPENDENT_AMBULATORY_CARE_PROVIDER_SITE_OTHER): Payer: 59

## 2022-09-21 DIAGNOSIS — J309 Allergic rhinitis, unspecified: Secondary | ICD-10-CM | POA: Diagnosis not present

## 2022-10-19 ENCOUNTER — Ambulatory Visit (INDEPENDENT_AMBULATORY_CARE_PROVIDER_SITE_OTHER): Payer: 59

## 2022-10-19 DIAGNOSIS — J309 Allergic rhinitis, unspecified: Secondary | ICD-10-CM | POA: Diagnosis not present

## 2022-11-16 ENCOUNTER — Ambulatory Visit (INDEPENDENT_AMBULATORY_CARE_PROVIDER_SITE_OTHER): Payer: 59

## 2022-11-16 DIAGNOSIS — J309 Allergic rhinitis, unspecified: Secondary | ICD-10-CM

## 2022-12-14 ENCOUNTER — Ambulatory Visit (INDEPENDENT_AMBULATORY_CARE_PROVIDER_SITE_OTHER): Payer: 59

## 2022-12-14 DIAGNOSIS — J309 Allergic rhinitis, unspecified: Secondary | ICD-10-CM

## 2022-12-22 DIAGNOSIS — J3081 Allergic rhinitis due to animal (cat) (dog) hair and dander: Secondary | ICD-10-CM

## 2022-12-22 NOTE — Progress Notes (Signed)
EXP 12/22/23 

## 2022-12-30 DIAGNOSIS — J301 Allergic rhinitis due to pollen: Secondary | ICD-10-CM

## 2023-01-11 ENCOUNTER — Ambulatory Visit (INDEPENDENT_AMBULATORY_CARE_PROVIDER_SITE_OTHER): Payer: 59

## 2023-01-11 DIAGNOSIS — J309 Allergic rhinitis, unspecified: Secondary | ICD-10-CM

## 2023-02-15 ENCOUNTER — Ambulatory Visit (INDEPENDENT_AMBULATORY_CARE_PROVIDER_SITE_OTHER): Payer: 59

## 2023-02-15 DIAGNOSIS — J309 Allergic rhinitis, unspecified: Secondary | ICD-10-CM | POA: Diagnosis not present

## 2023-03-08 ENCOUNTER — Telehealth: Payer: Self-pay

## 2023-03-08 ENCOUNTER — Ambulatory Visit: Payer: 59

## 2023-03-08 DIAGNOSIS — J309 Allergic rhinitis, unspecified: Secondary | ICD-10-CM

## 2023-03-24 ENCOUNTER — Ambulatory Visit (INDEPENDENT_AMBULATORY_CARE_PROVIDER_SITE_OTHER): Payer: 59

## 2023-03-24 DIAGNOSIS — J309 Allergic rhinitis, unspecified: Secondary | ICD-10-CM | POA: Diagnosis not present

## 2023-04-05 ENCOUNTER — Ambulatory Visit: Payer: 59

## 2023-04-05 DIAGNOSIS — J309 Allergic rhinitis, unspecified: Secondary | ICD-10-CM | POA: Diagnosis not present

## 2023-04-12 ENCOUNTER — Ambulatory Visit (INDEPENDENT_AMBULATORY_CARE_PROVIDER_SITE_OTHER): Payer: 59 | Admitting: *Deleted

## 2023-04-12 DIAGNOSIS — J309 Allergic rhinitis, unspecified: Secondary | ICD-10-CM | POA: Diagnosis not present

## 2023-05-10 ENCOUNTER — Ambulatory Visit (INDEPENDENT_AMBULATORY_CARE_PROVIDER_SITE_OTHER): Payer: 59

## 2023-05-10 DIAGNOSIS — J309 Allergic rhinitis, unspecified: Secondary | ICD-10-CM | POA: Diagnosis not present

## 2023-06-07 ENCOUNTER — Ambulatory Visit: Payer: 59 | Admitting: Allergy

## 2023-06-07 ENCOUNTER — Ambulatory Visit: Payer: 59

## 2023-06-07 ENCOUNTER — Encounter: Payer: Self-pay | Admitting: Allergy

## 2023-06-07 VITALS — BP 134/80 | HR 89 | Temp 98.2°F | Resp 18 | Ht 72.5 in | Wt 364.0 lb

## 2023-06-07 DIAGNOSIS — T7800XD Anaphylactic reaction due to unspecified food, subsequent encounter: Secondary | ICD-10-CM | POA: Diagnosis not present

## 2023-06-07 DIAGNOSIS — J4520 Mild intermittent asthma, uncomplicated: Secondary | ICD-10-CM

## 2023-06-07 DIAGNOSIS — J301 Allergic rhinitis due to pollen: Secondary | ICD-10-CM

## 2023-06-07 DIAGNOSIS — J3081 Allergic rhinitis due to animal (cat) (dog) hair and dander: Secondary | ICD-10-CM | POA: Diagnosis not present

## 2023-06-07 DIAGNOSIS — J309 Allergic rhinitis, unspecified: Secondary | ICD-10-CM

## 2023-06-07 DIAGNOSIS — J3089 Other allergic rhinitis: Secondary | ICD-10-CM

## 2023-06-07 DIAGNOSIS — Z91038 Other insect allergy status: Secondary | ICD-10-CM

## 2023-06-07 NOTE — Patient Instructions (Addendum)
Rhinitis  Return for environmental allergy skin testing. Will make additional recommendations based on results. Whether to remix and/or increase cat injections.  Make sure you don't take any antihistamines for 3 days before the skin testing appointment. Don't put any lotion on the back and arms on the day of testing.  Plan on being here for 30-60 minutes.   See below for environmental control measures. Use over the counter antihistamines such as Zyrtec (cetirizine), Claritin (loratadine), Allegra (fexofenadine), or Xyzal (levocetirizine) daily as needed. May take twice a day during allergy flares. May switch antihistamines every few months.  Asthma Normal breathing test today. May use albuterol rescue inhaler 2 puffs every 4 to 6 hours as needed for shortness of breath, chest tightness, coughing, and wheezing.  Monitor frequency of use - if you need to use it more than twice per week on a consistent basis let us know.   Food allergy Continue strict avoidance of shellfish and pork. Will recheck at next visit. For mild symptoms you can take over the counter antihistamines such as Benadryl 1-2 tablets = 25-50mg  and monitor symptoms closely. If symptoms worsen or if you have severe symptoms including breathing issues, throat closure, significant swelling, whole body hives, severe diarrhea and vomiting, lightheadedness then inject epinephrine and seek immediate medical care afterwards. Emergency action plan in place.   Follow up for allergy skin testing.   Pet Allergen Avoidance: Contrary to popular opinion, there are no "hypoallergenic" breeds of dogs or cats. That is because people are not allergic to an animal's hair, but to an allergen found in the animal's saliva, dander (dead skin flakes) or urine. Pet allergy symptoms typically occur within minutes. For some people, symptoms can build up and become most severe 8 to 12 hours after contact with the animal. People with severe allergies can  experience reactions in public places if dander has been transported on the pet owners' clothing. Keeping an animal outdoors is only a partial solution, since homes with pets in the yard still have higher concentrations of animal allergens. Before getting a pet, ask your allergist to determine if you are allergic to animals. If your pet is already considered part of your family, try to minimize contact and keep the pet out of the bedroom and other rooms where you spend a great deal of time. As with dust mites, vacuum carpets often or replace carpet with a hardwood floor, tile or linoleum. High-efficiency particulate air (HEPA) cleaners can reduce allergen levels over time. While dander and saliva are the source of cat and dog allergens, urine is the source of allergens from rabbits, hamsters, mice and [place] pigs; so ask a non-allergic family member to clean the animal's cage. If you have a pet allergy, talk to your allergist about the potential for allergy immunotherapy (allergy shots). This strategy can often provide long-term relief.  Reducing Pollen Exposure Pollen seasons: trees (spring), grass (summer) and ragweed/weeds (fall). Keep windows closed in your home and car to lower pollen exposure.  Install air conditioning in the bedroom and throughout the house if possible.  Avoid going out in dry windy days - especially early morning. Pollen counts are highest between 5 - 10 AM and on dry, hot and windy days.  Save outside activities for late afternoon or after a heavy rain, when pollen levels are lower.  Avoid mowing of grass if you have grass pollen allergy. Be aware that pollen can also be transported indoors on people and pets.  Dry your clothes  in an automatic dryer rather than hanging them outside where they might collect pollen.  Rinse hair and eyes before bedtime.  Control of House Dust Mite Allergen Dust mite allergens are a common trigger of allergy and asthma symptoms. While they  can be found throughout the house, these microscopic creatures thrive in warm, humid environments such as bedding, upholstered furniture and carpeting. Because so much time is spent in the bedroom, it is essential to reduce mite levels there.  Encase pillows, mattresses, and box springs in special allergen-proof fabric covers or airtight, zippered plastic covers.  Bedding should be washed weekly in hot water (130 F) and dried in a hot dryer. Allergen-proof covers are available for comforters and pillows that can't be regularly washed.  Wash the allergy-proof covers every few months. Minimize clutter in the bedroom. Keep pets out of the bedroom.  Keep humidity less than 50% by using a dehumidifier or air conditioning. You can buy a humidity measuring device called a hygrometer to monitor this.  If possible, replace carpets with hardwood, linoleum, or washable area rugs. If that's not possible, vacuum frequently with a vacuum that has a HEPA filter. Remove all upholstered furniture and non-washable window drapes from the bedroom. Remove all non-washable stuffed toys from the bedroom.  Wash stuffed toys weekly.

## 2023-06-07 NOTE — Progress Notes (Signed)
Follow Up Note  RE: Rachel Mercado MRN: [id number] DOB: 11/18/1965 Date of Office Visit: 06/07/2023  Referring provider: Wilfred Curtis, MD Primary care provider: Wilfred Curtis, MD  Chief Complaint: Allergies (Everything is going good)  History of Present Illness: I had the pleasure of seeing Rachel Mercado for a follow up visit at the [facility] on 06/07/2023. She is a 57 y.o. female, who is being followed for food allergy, allergic rhinitis on AIT, asthma. Her previous allergy office visit was on 03/17/2021 with Dr. Selena Batten. Today is a regular follow up visit.  Discussed the use of AI scribe software for clinical note transcription with the patient, who gave verbal consent to proceed.  The patient has been on allergy shots since 2019. Despite the treatment, she continues to experience allergic symptoms, primarily when her cat sits on her chest. These reactions manifest as breathlessness and nasal stuffiness. Occasionally, she develops a rash if the cat licks her or makes skin contact. The patient manages these symptoms with Xyzal at night and Claritin during the day as needed, with occasional use of Flonase.  The patient reports that the allergy shots have been beneficial for her outdoor allergies, particularly grass, which is not as bothersome as it was prior to the treatment. She has not had any adverse reactions to the allergy shots.  In addition to her allergies, the patient has asthma, which is generally well-controlled. She only experiences breathing difficulties when her cat sits on her chest or when exposed to freshly cut grass. She has an albuterol inhaler, which she uses infrequently, only when the symptoms are severe. She has not had any recent emergency room visits or urgent care visits for asthma.  The patient also has a history of food allergies, specifically to shellfish and pork, which she strictly avoids. She has not had any recent allergic reactions to these  foods.  In the past two years, the patient underwent carpal tunnel surgery but has not had any other significant changes in her health status. She has not started any new medications and does not require any refills at this time.   Assessment and Plan: Rachel Mercado is a 57 y.o. female with: Seasonal allergic rhinitis due to pollen Allergic rhinitis due to animal dander Allergic rhinitis due to dust mite Allergy to cockroaches Past history - on AIT in our office since 2019 (Weeds-Tree & Grass-Dmite-Cat-CR). 1 dog and 1 cat at home.  Interim history - Symptoms improved with allergy shots since 2019, but still experiences stuffy nose and rash when in close contact with pet cat. Currently on Xyzal and Claritin as needed, and rarely uses Flonase. Return for environmental allergy skin testing. Will make additional recommendations based on results. Whether to remix and/or increase cat injections.  See below for environmental control measures. Use over the counter antihistamines such as Zyrtec (cetirizine), Claritin (loratadine), Allegra (fexofenadine), or Xyzal (levocetirizine) daily as needed. May take twice a day during allergy flares. May switch antihistamines every few months.  Mild intermittent asthma without complication No recent exacerbations or need for inhaler use. Triggers include grass or close contact with cat. Normal spirometry today. May use albuterol rescue inhaler 2 puffs every 4 to 6 hours as needed for shortness of breath, chest tightness, coughing, and wheezing.  Monitor frequency of use - if you need to use it more than twice per week on a consistent basis let us know.   Anaphylactic reaction due to food, subsequent encounter Past history - shellfish  and pork caused hives. One episode of itching/hives with [facility]. Tolerates finned fish. Interim history - no reactions. Continue strict avoidance of shellfish and pork. Will recheck at next visit. For mild symptoms you can take  over the counter antihistamines such as Benadryl 1-2 tablets = 25-50mg  and monitor symptoms closely. If symptoms worsen or if you have severe symptoms including breathing issues, throat closure, significant swelling, whole body hives, severe diarrhea and vomiting, lightheadedness then inject epinephrine and seek immediate medical care afterwards. Emergency action plan in place.   Return for Skin testing.  No orders of the defined types were placed in this encounter.  Lab Orders  No laboratory test(s) ordered today    Diagnostics: Spirometry:  Tracings reviewed. Her effort: Good reproducible efforts. FVC: 3.08L FEV1: 2.66L, 89% predicted FEV1/FVC ratio: 86% Interpretation: Spirometry consistent with normal pattern.  Please see scanned spirometry results for details.  Medication List:  Current Outpatient Medications  Medication Sig Dispense Refill   albuterol (VENTOLIN HFA) 108 (90 Base) MCG/ACT inhaler Inhale 2 puffs into the lungs every 4 (four) hours as needed for wheezing or shortness of breath (coughing fits). 8 g 1   AUVI-Q 0.3 MG/0.3ML SOAJ injection Inject 0.3 mg into the muscle as needed for anaphylaxis. 1 each 1   Ferrous Sulfate Dried (FERROUS SULFATE IRON PO) Take by mouth.     fluticasone (FLONASE) 50 MCG/ACT nasal spray Place 1 spray into both nostrils 2 (two) times daily as needed for allergies or rhinitis. 16 g 3   HYDROcodone-acetaminophen (NORCO/VICODIN) 5-325 MG tablet Take 1 tablet by mouth every 6 (six) hours as needed.     levocetirizine (XYZAL) 5 MG tablet Take 5 mg by mouth every evening.     levothyroxine (SYNTHROID) 100 MCG tablet Take 100 mcg by mouth daily.     Menthol (ICY HOT ADVANCED RELIEF) 7.5 % PTCH Apply topically.     Multiple Vitamin (MULTIVITAMIN) tablet Take 1 tablet by mouth daily.     Vitamin D, Ergocalciferol, (DRISDOL) 50000 units CAPS capsule Take 5,000 Units by mouth 1 day or 1 dose.     No current facility-administered medications for this  visit.   Allergies: Allergies  Allergen Reactions   Other Shortness Of Breath    TREES MOLD CATS DOGS DUST MITES   Aspirin Other (See Comments)    CAN NOT TAKE D/T H/O GASTRIC BYPASS CAN NOT TAKE D/T H/O GASTRIC BYPASS    Ibuprofen Other (See Comments)    CAN NOT TAKE D/T H/O GASTRIC BYPASS CAN NOT TAKE D/T H/O GASTRIC BYPASS    Shellfish Allergy Hives   I reviewed her past medical history, social history, family history, and environmental history and no significant changes have been reported from her previous visit.  Review of Systems  Constitutional:  Negative for appetite change, chills, fever and unexpected weight change.  HENT:  Negative for congestion and rhinorrhea.   Eyes:  Negative for itching.  Respiratory:  Negative for cough, chest tightness, shortness of breath and wheezing.   Cardiovascular:  Negative for chest pain.  Gastrointestinal:  Negative for abdominal pain.  Genitourinary:  Negative for difficulty urinating.  Skin:  Negative for rash.  Allergic/Immunologic: Positive for environmental allergies and food allergies.  Neurological:  Negative for headaches.    Objective: BP 134/80 (BP Location: Right Arm, Patient Position: Sitting, Cuff Size: Large)   Pulse 89   Temp 98.2 F (36.8 C) (Temporal)   Resp 18   Ht 6' 0.5" (1.842 m)  Wt (!) 364 lb (165.1 kg)   LMP  (LMP Unknown)   SpO2 98%   BMI 48.69 kg/m  Body mass index is 48.69 kg/m. Physical Exam Vitals and nursing note reviewed.  Constitutional:      Appearance: Normal appearance. She is well-developed.  HENT:     Head: Normocephalic and atraumatic.     Right Ear: Tympanic membrane and external ear normal.     Left Ear: Tympanic membrane and external ear normal.     Nose: Nose normal.     Mouth/Throat:     Mouth: Mucous membranes are moist.     Pharynx: Oropharynx is clear.  Eyes:     Conjunctiva/sclera: Conjunctivae normal.  Cardiovascular:     Rate and Rhythm: Normal rate and  regular rhythm.     Heart sounds: Normal heart sounds. No murmur heard.    No friction rub. No gallop.  Pulmonary:     Effort: Pulmonary effort is normal.     Breath sounds: Normal breath sounds. No wheezing, rhonchi or rales.  Musculoskeletal:     Cervical back: Neck supple.  Skin:    General: Skin is warm.     Findings: No rash.  Neurological:     Mental Status: She is alert and oriented to person, place, and time.  Psychiatric:        Behavior: Behavior normal.   Previous notes and tests were reviewed. The plan was reviewed with the patient/family, and all questions/concerned were addressed.  It was my pleasure to see Rachel Mercado today and participate in her care. Please feel free to contact me with any questions or concerns.  Sincerely,  Wyline Mood, DO Allergy & Immunology  [facility] office: [phone] [facility] office: [phone]

## 2023-06-20 NOTE — Progress Notes (Unsigned)
Skin testing note  RE: Rachel Mercado MRN: [id number] DOB: 22-Mar-1966 Date of Office Visit: 06/21/2023  Referring provider: Wilfred Curtis, MD Primary care provider: Wilfred Curtis, MD  Chief Complaint: skin testing  History of Present Illness: I had the pleasure of seeing Rachel Mercado for a skin testing visit at the [facility] on 06/21/2023. She is a 57 y.o. female, who is being followed for allergic rhinitis on AIT, asthma, food allergy. Her previous allergy office visit was on 06/07/2023 with Dr. Selena Batten. Today is a skin testing visit.   Discussed the use of AI scribe software for clinical note transcription with the patient, who gave verbal consent to proceed.  The patient has been receiving allergy shots for symptom management. She reports continued symptoms despite the ongoing treatment.  The patient has been avoiding shellfish and pork since her early to mid-twenties due to associated issues. Despite a borderline skin test result for shrimp, the patient has decided against further testing and plans to continue avoiding these foods.     Assessment and Plan: Rachel Mercado is a 57 y.o. female with: Other allergic rhinitis Past history - on AIT in our office since 2019 (Weeds-Tree & Grass-Dmite-Cat-CR). 1 dog and 1 cat at home. Symptoms improved with allergy shots since 2019, but still experiences stuffy nose and rash when in close contact with pet cat.  Today's skin testing positive to grass, ragweed, weed, dust mites, cat, dog, cockroach, tobacco. Start environmental control measures as below. Use over the counter antihistamines such as Zyrtec (cetirizine), Claritin (loratadine), Allegra (fexofenadine), or Xyzal (levocetirizine) daily as needed. May take twice a day during allergy flares. May switch antihistamines every few months. Sent patient message regarding adding dog and removing trees and cockroach from her vials if cockroach is not an issue for her. This would create  1 shot but will require build up vs continue with current regimen of 2 vials and leaving dog out.   Anaphylactic reaction due to food, subsequent encounter Past history - shellfish and pork caused hives. One episode of itching/hives with [facility]. Tolerates finned fish. Today's skin testing positive to shrimp, negative to pork.  Continue strict avoidance of shellfish and pork. Let me know in the future if interested in reintroduction then will order bloodwork next.   For mild symptoms you can take over the counter antihistamines such as Benadryl 1-2 tablets = 25-50mg  and monitor symptoms closely. If symptoms worsen or if you have severe symptoms including breathing issues, throat closure, significant swelling, whole body hives, severe diarrhea and vomiting, lightheadedness then inject epinephrine and seek immediate medical care afterwards. Emergency action plan in place.   Mild intermittent asthma without complication Past history - No recent exacerbations or need for inhaler use. Triggers include grass or close contact with cat. 2024 spirometry normal.  May use albuterol rescue inhaler 2 puffs every 4 to 6 hours as needed for shortness of breath, chest tightness, coughing, and wheezing.  Monitor frequency of use - if you need to use it more than twice per week on a consistent basis let us know.   Return in about 6 months (around 12/19/2023).  No orders of the defined types were placed in this encounter.  Lab Orders  No laboratory test(s) ordered today    Diagnostics: Skin Testing: Environmental allergy panel and select foods. Today's skin testing positive to grass, ragweed, weed, dust mites, cat, dog, cockroach, tobacco. Positive to shrimp.  Results discussed with patient/family.  Airborne Adult Perc -  06/21/23 0855     Time Antigen Placed 0850    Allergen Manufacturer Waynette Buttery    Location Back    Number of Test 55    1. Control-Buffer 50% Glycerol Negative    2. Control-Histamine 3+     3. Bahia Negative    4. French Southern Territories Negative    5. Johnson Negative    6. Kentucky Blue Negative    7. Meadow Fescue Negative    8. Perennial Rye Negative    9. Timothy Negative    10. Ragweed Mix Negative    11. Cocklebur Negative    12. Plantain,  English Negative    13. Baccharis Negative    14. Dog Fennel Negative    15. Russian Thistle Negative    16. Lamb's Quarters Negative    17. Sheep Sorrell Negative    18. Rough Pigweed Negative    19. Marsh Elder, Rough Negative    20. Mugwort, Common Negative    21. Box, Elder Negative    22. Cedar, red Negative    23. Sweet Gum Negative    24. Pecan Pollen Negative    25. Pine Mix Negative    26. Walnut, Black Pollen Negative    27. Red Mulberry Negative    28. Ash Mix Negative    29. Birch Mix Negative    30. Beech American Negative    31. Cottonwood, Guinea-Bissau Negative    32. Hickory, White Negative    33. Maple Mix Negative    34. Oak, Guinea[place]issau Mix Negative    35. Sycamore Eastern Negative    36. Alternaria Alternata Negative    37. Cladosporium Herbarum Negative    38. Aspergillus Mix Negative    39. Penicillium Mix Negative    40. Bipolaris Sorokiniana (Helminthosporium) Negative    41. Drechslera Spicifera (Curvularia) Negative    42. Mucor Plumbeus Negative    43. Fusarium Moniliforme Negative    44. Aureobasidium Pullulans (pullulara) Negative    45. Rhizopus Oryzae Negative    46. Botrytis Cinera Negative    47. Epicoccum Nigrum Negative    48. Phoma Betae Negative    49. Dust Mite Mix Negative    50. Cat Hair 10,000 BAU/ml Negative    51.  Dog Epithelia Negative    52. Mixed Feathers Negative    53. Horse Epithelia Negative    54. Cockroach, German 2+    55. Tobacco Leaf Negative             Intradermal - 06/21/23 0913     Time Antigen Placed 0915    Allergen Manufacturer Waynette Buttery    Location Back    Number of Test 15    Control Negative    Bahia Negative    French Southern Territ[place] 2+    Johnson Negative    7  Grass 2+    Ragweed Mix 2+    Weed Mix 2+    Tree Mix Negative    Mold 1 Negative    Mold 2 Negative    Mold 3 Negative    Mold 4 Negative    Mite Mix 2+    Cat 3+    Dog --   +/-            Food Adult Perc - 06/21/23 0800     Time Antigen Placed 1610    Allergen Manufacturer Waynette Buttery    Location Back    Number of allergen test 6    23. Shrimp --  5x5   24. Crab Negative    25. Lobster Negative    26. Oyster Negative    27. Scallops Negative    35. Pork Negative             Previous notes and tests were reviewed. The plan was reviewed with the patient/family, and all questions/concerned were addressed.  It was my pleasure to see Rachel Mercado today and participate in her care. Please feel free to contact me with any questions or concerns.  Sincerely,  Wyline Mood, DO Allergy & Immunology  [facility] office: [phone] [facility] office: [phone]

## 2023-06-21 ENCOUNTER — Encounter: Payer: Self-pay | Admitting: Allergy

## 2023-06-21 ENCOUNTER — Ambulatory Visit: Payer: 59 | Admitting: Allergy

## 2023-06-21 DIAGNOSIS — J3089 Other allergic rhinitis: Secondary | ICD-10-CM

## 2023-06-21 DIAGNOSIS — T7800XD Anaphylactic reaction due to unspecified food, subsequent encounter: Secondary | ICD-10-CM | POA: Diagnosis not present

## 2023-06-21 DIAGNOSIS — J4520 Mild intermittent asthma, uncomplicated: Secondary | ICD-10-CM

## 2023-06-21 NOTE — Patient Instructions (Addendum)
Today's skin testing positive to grass, ragweed, weed, dust mites, cat, dog, cockroach, tobacco. Positive to shrimp.   Results given.  Environmental allergies Start environmental control measures as below. Use over the counter antihistamines such as Zyrtec (cetirizine), Claritin (loratadine), Allegra (fexofenadine), or Xyzal (levocetirizine) daily as needed. May take twice a day during allergy flares. May switch antihistamines every few months. Continue allergy injections - I will send you a message regarding how your new formulation would change.   Asthma May use albuterol rescue inhaler 2 puffs every 4 to 6 hours as needed for shortness of breath, chest tightness, coughing, and wheezing.  Monitor frequency of use - if you need to use it more than twice per week on a consistent basis let us know.   Food allergy Continue strict avoidance of shellfish and pork. Let me know in the future if interested in reintroduction.  For mild symptoms you can take over the counter antihistamines such as Benadryl 1-2 tablets = 25-50mg  and monitor symptoms closely. If symptoms worsen or if you have severe symptoms including breathing issues, throat closure, significant swelling, whole body hives, severe diarrhea and vomiting, lightheadedness then inject epinephrine and seek immediate medical care afterwards. Emergency action plan in place.   Follow up in 6 months or sooner if needed.

## 2023-06-22 NOTE — Telephone Encounter (Signed)
Please mail lab orders for shellfish with [place] map to patient's home.

## 2023-06-30 LAB — ALLERGEN PROFILE, SHELLFISH
Clam IgE: <0.1 kU/L
F023-IgE Crab: 0.15 kU/L — AB
F080-IgE Lobster: <0.1 kU/L
F290-IgE Oyster: <0.1 kU/L
Scallop IgE: <0.1 kU/L
Shrimp IgE: 0.14 kU/L — AB

## 2023-07-13 ENCOUNTER — Other Ambulatory Visit (HOSPITAL_BASED_OUTPATIENT_CLINIC_OR_DEPARTMENT_OTHER): Payer: Self-pay

## 2023-07-13 MED ORDER — FLULAVAL 0.5 ML IM SUSY
0.5000 mL | PREFILLED_SYRINGE | Freq: Once | INTRAMUSCULAR | 0 refills | Status: AC
  Filled 2023-07-13: qty 0.5, 1d supply, fill #0

## 2023-07-13 MED ORDER — COMIRNATY 30 MCG/0.3ML IM SUSY
0.3000 mL | PREFILLED_SYRINGE | Freq: Once | INTRAMUSCULAR | 0 refills | Status: AC
  Filled 2023-07-13: qty 0.3, 1d supply, fill #0

## 2023-07-21 ENCOUNTER — Ambulatory Visit (INDEPENDENT_AMBULATORY_CARE_PROVIDER_SITE_OTHER): Payer: 59

## 2023-07-21 DIAGNOSIS — J3089 Other allergic rhinitis: Secondary | ICD-10-CM | POA: Diagnosis not present
